# Patient Record
Sex: Female | Born: 1985
Health system: Southern US, Community
[De-identification: ages and names within clinical notes are randomized; demographics above are authoritative.]

## PROBLEM LIST (undated history)

## (undated) DIAGNOSIS — I951 Orthostatic hypotension: Secondary | ICD-10-CM

## (undated) DIAGNOSIS — L988 Other specified disorders of the skin and subcutaneous tissue: Secondary | ICD-10-CM

## (undated) DIAGNOSIS — J309 Allergic rhinitis, unspecified: Secondary | ICD-10-CM

## (undated) DIAGNOSIS — J9601 Acute respiratory failure with hypoxia: Secondary | ICD-10-CM

## (undated) DIAGNOSIS — B372 Candidiasis of skin and nail: Secondary | ICD-10-CM

## (undated) DIAGNOSIS — G40A09 Absence epileptic syndrome, not intractable, without status epilepticus: Secondary | ICD-10-CM

## (undated) DIAGNOSIS — F259 Schizoaffective disorder, unspecified: Secondary | ICD-10-CM

## (undated) DIAGNOSIS — G8929 Other chronic pain: Secondary | ICD-10-CM

## (undated) DIAGNOSIS — J42 Unspecified chronic bronchitis: Secondary | ICD-10-CM

## (undated) DIAGNOSIS — H538 Other visual disturbances: Secondary | ICD-10-CM

## (undated) DIAGNOSIS — D509 Iron deficiency anemia, unspecified: Secondary | ICD-10-CM

## (undated) DIAGNOSIS — E119 Type 2 diabetes mellitus without complications: Secondary | ICD-10-CM

## (undated) DIAGNOSIS — E1142 Type 2 diabetes mellitus with diabetic polyneuropathy: Secondary | ICD-10-CM

## (undated) DIAGNOSIS — R609 Edema, unspecified: Secondary | ICD-10-CM

## (undated) DIAGNOSIS — F3132 Bipolar disorder, current episode depressed, moderate: Secondary | ICD-10-CM

## (undated) DIAGNOSIS — F17210 Nicotine dependence, cigarettes, uncomplicated: Secondary | ICD-10-CM

## (undated) DIAGNOSIS — S2243XG Multiple fractures of ribs, bilateral, subsequent encounter for fracture with delayed healing: Secondary | ICD-10-CM

## (undated) DIAGNOSIS — I10 Essential (primary) hypertension: Secondary | ICD-10-CM

## (undated) DIAGNOSIS — D803 Selective deficiency of immunoglobulin G [IgG] subclasses: Secondary | ICD-10-CM

## (undated) DIAGNOSIS — E559 Vitamin D deficiency, unspecified: Secondary | ICD-10-CM

## (undated) DIAGNOSIS — F489 Nonpsychotic mental disorder, unspecified: Secondary | ICD-10-CM

## (undated) DIAGNOSIS — J479 Bronchiectasis, uncomplicated: Secondary | ICD-10-CM

## (undated) DIAGNOSIS — G4733 Obstructive sleep apnea (adult) (pediatric): Secondary | ICD-10-CM

## (undated) DIAGNOSIS — R112 Nausea with vomiting, unspecified: Secondary | ICD-10-CM

## (undated) DIAGNOSIS — M549 Dorsalgia, unspecified: Secondary | ICD-10-CM

## (undated) DIAGNOSIS — N939 Abnormal uterine and vaginal bleeding, unspecified: Secondary | ICD-10-CM

## (undated) DIAGNOSIS — M542 Cervicalgia: Secondary | ICD-10-CM

## (undated) DIAGNOSIS — R6889 Other general symptoms and signs: Secondary | ICD-10-CM

## (undated) DIAGNOSIS — U071 COVID-19: Secondary | ICD-10-CM

## (undated) DIAGNOSIS — R0789 Other chest pain: Secondary | ICD-10-CM

## (undated) DIAGNOSIS — E11319 Type 2 diabetes mellitus with unspecified diabetic retinopathy without macular edema: Secondary | ICD-10-CM

## (undated) DIAGNOSIS — I5032 Chronic diastolic (congestive) heart failure: Secondary | ICD-10-CM

## (undated) DIAGNOSIS — F4310 Post-traumatic stress disorder, unspecified: Secondary | ICD-10-CM

## (undated) DIAGNOSIS — L259 Unspecified contact dermatitis, unspecified cause: Secondary | ICD-10-CM

## (undated) DIAGNOSIS — T300 Burn of unspecified body region, unspecified degree: Secondary | ICD-10-CM

## (undated) DIAGNOSIS — E441 Mild protein-calorie malnutrition: Secondary | ICD-10-CM

## (undated) DIAGNOSIS — K219 Gastro-esophageal reflux disease without esophagitis: Secondary | ICD-10-CM

## (undated) DIAGNOSIS — E1165 Type 2 diabetes mellitus with hyperglycemia: Secondary | ICD-10-CM

## (undated) DIAGNOSIS — J45909 Unspecified asthma, uncomplicated: Secondary | ICD-10-CM

## (undated) DIAGNOSIS — J449 Chronic obstructive pulmonary disease, unspecified: Secondary | ICD-10-CM

## (undated) DIAGNOSIS — F419 Anxiety disorder, unspecified: Secondary | ICD-10-CM

## (undated) HISTORY — DX: Bronchiectasis, uncomplicated: J47.9

## (undated) HISTORY — DX: Nicotine dependence, cigarettes, uncomplicated: F17.210

## (undated) HISTORY — DX: Bipolar disorder, current episode depressed, moderate: F31.32

## (undated) HISTORY — DX: Cervicalgia: M54.2

## (undated) HISTORY — DX: Other chest pain: R07.89

## (undated) HISTORY — DX: Type 2 diabetes mellitus with unspecified diabetic retinopathy without macular edema: E11.319

## (undated) HISTORY — DX: Obstructive sleep apnea (adult) (pediatric): G47.33

## (undated) HISTORY — DX: Unspecified asthma, uncomplicated: J45.909

## (undated) HISTORY — PX: TUBAL LIGATION: SHX77

## (undated) HISTORY — DX: Post-traumatic stress disorder, unspecified: F43.10

## (undated) HISTORY — DX: Allergic rhinitis, unspecified: J30.9

## (undated) HISTORY — DX: Orthostatic hypotension: I95.1

## (undated) HISTORY — DX: Schizoaffective disorder, unspecified: F25.9

## (undated) HISTORY — DX: Dorsalgia, unspecified: M54.9

## (undated) HISTORY — DX: Absence epileptic syndrome, not intractable, without status epilepticus: G40.A09

## (undated) HISTORY — DX: Other chronic pain: G89.29

## (undated) HISTORY — DX: Type 2 diabetes mellitus with hyperglycemia: E11.65

## (undated) HISTORY — DX: Other specified disorders of the skin and subcutaneous tissue: L98.8

## (undated) HISTORY — DX: Essential (primary) hypertension: I10

## (undated) HISTORY — DX: Unspecified chronic bronchitis: J42

## (undated) HISTORY — DX: Chronic obstructive pulmonary disease, unspecified: J44.9

## (undated) HISTORY — DX: Unspecified contact dermatitis, unspecified cause: L25.9

## (undated) HISTORY — DX: Selective deficiency of immunoglobulin g (igg) subclasses: D80.3

## (undated) HISTORY — DX: Acute respiratory failure with hypoxia: J96.01

## (undated) HISTORY — DX: Chronic diastolic (congestive) heart failure: I50.32

## (undated) HISTORY — DX: Candidiasis of skin and nail: B37.2

## (undated) HISTORY — DX: Other visual disturbances: H53.8

## (undated) HISTORY — DX: Anxiety disorder, unspecified: F41.9

## (undated) HISTORY — PX: TONSILLECTOMY: SUR1361

## (undated) HISTORY — DX: Burn of unspecified body region, unspecified degree: T30.0

## (undated) HISTORY — DX: Nausea with vomiting, unspecified: R11.2

## (undated) HISTORY — DX: Type 2 diabetes mellitus with diabetic polyneuropathy: E11.42

## (undated) HISTORY — DX: Other general symptoms and signs: R68.89

## (undated) HISTORY — DX: Multiple fractures of ribs, bilateral, subsequent encounter for fracture with delayed healing: S22.43XG

## (undated) HISTORY — DX: Abnormal uterine and vaginal bleeding, unspecified: N93.9

## (undated) HISTORY — DX: Vitamin D deficiency, unspecified: E55.9

## (undated) HISTORY — DX: Morbid (severe) obesity due to excess calories: E66.01

## (undated) HISTORY — DX: Mild protein-calorie malnutrition: E44.1

## (undated) HISTORY — DX: COVID-19: U07.1

## (undated) HISTORY — DX: Iron deficiency anemia, unspecified: D50.9

## (undated) HISTORY — DX: Edema, unspecified: R60.9

## (undated) HISTORY — DX: Gastro-esophageal reflux disease without esophagitis: K21.9

---

## 2016-12-05 DIAGNOSIS — Z01419 Encounter for gynecological examination (general) (routine) without abnormal findings: Secondary | ICD-10-CM | POA: Diagnosis not present

## 2016-12-05 DIAGNOSIS — L03311 Cellulitis of abdominal wall: Secondary | ICD-10-CM | POA: Diagnosis not present

## 2016-12-12 DIAGNOSIS — L0291 Cutaneous abscess, unspecified: Secondary | ICD-10-CM | POA: Diagnosis not present

## 2016-12-12 DIAGNOSIS — L02211 Cutaneous abscess of abdominal wall: Secondary | ICD-10-CM | POA: Diagnosis not present

## 2016-12-19 DIAGNOSIS — Z5189 Encounter for other specified aftercare: Secondary | ICD-10-CM | POA: Diagnosis not present

## 2016-12-22 DIAGNOSIS — H52223 Regular astigmatism, bilateral: Secondary | ICD-10-CM | POA: Diagnosis not present

## 2016-12-22 DIAGNOSIS — H5310 Unspecified subjective visual disturbances: Secondary | ICD-10-CM | POA: Diagnosis not present

## 2016-12-22 DIAGNOSIS — H5213 Myopia, bilateral: Secondary | ICD-10-CM | POA: Diagnosis not present

## 2017-02-04 DIAGNOSIS — E785 Hyperlipidemia, unspecified: Secondary | ICD-10-CM | POA: Diagnosis not present

## 2017-02-04 DIAGNOSIS — E559 Vitamin D deficiency, unspecified: Secondary | ICD-10-CM | POA: Diagnosis not present

## 2017-02-04 DIAGNOSIS — E109 Type 1 diabetes mellitus without complications: Secondary | ICD-10-CM | POA: Diagnosis not present

## 2017-02-04 DIAGNOSIS — I10 Essential (primary) hypertension: Secondary | ICD-10-CM | POA: Diagnosis not present

## 2017-02-23 DIAGNOSIS — Z79899 Other long term (current) drug therapy: Secondary | ICD-10-CM | POA: Diagnosis not present

## 2017-05-08 DIAGNOSIS — R000 Tachycardia, unspecified: Secondary | ICD-10-CM | POA: Diagnosis not present

## 2017-05-08 DIAGNOSIS — Z794 Long term (current) use of insulin: Secondary | ICD-10-CM | POA: Diagnosis not present

## 2017-05-08 DIAGNOSIS — R05 Cough: Secondary | ICD-10-CM | POA: Diagnosis not present

## 2017-05-08 DIAGNOSIS — E109 Type 1 diabetes mellitus without complications: Secondary | ICD-10-CM | POA: Diagnosis not present

## 2017-05-13 DIAGNOSIS — I10 Essential (primary) hypertension: Secondary | ICD-10-CM | POA: Diagnosis not present

## 2017-05-13 DIAGNOSIS — E109 Type 1 diabetes mellitus without complications: Secondary | ICD-10-CM | POA: Diagnosis not present

## 2017-05-13 DIAGNOSIS — E785 Hyperlipidemia, unspecified: Secondary | ICD-10-CM | POA: Diagnosis not present

## 2017-05-13 DIAGNOSIS — E669 Obesity, unspecified: Secondary | ICD-10-CM | POA: Diagnosis not present

## 2017-06-09 DIAGNOSIS — B86 Scabies: Secondary | ICD-10-CM | POA: Diagnosis not present

## 2017-07-23 DIAGNOSIS — R490 Dysphonia: Secondary | ICD-10-CM | POA: Diagnosis not present

## 2017-07-23 DIAGNOSIS — K219 Gastro-esophageal reflux disease without esophagitis: Secondary | ICD-10-CM | POA: Diagnosis not present

## 2017-08-17 DIAGNOSIS — E11319 Type 2 diabetes mellitus with unspecified diabetic retinopathy without macular edema: Secondary | ICD-10-CM | POA: Diagnosis not present

## 2017-08-17 DIAGNOSIS — Z794 Long term (current) use of insulin: Secondary | ICD-10-CM | POA: Diagnosis not present

## 2017-08-17 DIAGNOSIS — E10319 Type 1 diabetes mellitus with unspecified diabetic retinopathy without macular edema: Secondary | ICD-10-CM | POA: Diagnosis not present

## 2017-08-17 DIAGNOSIS — H52223 Regular astigmatism, bilateral: Secondary | ICD-10-CM | POA: Diagnosis not present

## 2017-08-17 DIAGNOSIS — H5213 Myopia, bilateral: Secondary | ICD-10-CM | POA: Diagnosis not present

## 2017-10-02 DIAGNOSIS — E785 Hyperlipidemia, unspecified: Secondary | ICD-10-CM | POA: Diagnosis not present

## 2017-10-02 DIAGNOSIS — E109 Type 1 diabetes mellitus without complications: Secondary | ICD-10-CM | POA: Diagnosis not present

## 2017-10-02 DIAGNOSIS — E559 Vitamin D deficiency, unspecified: Secondary | ICD-10-CM | POA: Diagnosis not present

## 2017-10-07 DIAGNOSIS — E785 Hyperlipidemia, unspecified: Secondary | ICD-10-CM | POA: Diagnosis not present

## 2017-10-07 DIAGNOSIS — E559 Vitamin D deficiency, unspecified: Secondary | ICD-10-CM | POA: Diagnosis not present

## 2017-10-07 DIAGNOSIS — I10 Essential (primary) hypertension: Secondary | ICD-10-CM | POA: Diagnosis not present

## 2017-10-07 DIAGNOSIS — E109 Type 1 diabetes mellitus without complications: Secondary | ICD-10-CM | POA: Diagnosis not present

## 2017-10-12 DIAGNOSIS — I10 Essential (primary) hypertension: Secondary | ICD-10-CM | POA: Diagnosis not present

## 2017-10-12 DIAGNOSIS — Z6841 Body Mass Index (BMI) 40.0 and over, adult: Secondary | ICD-10-CM | POA: Diagnosis not present

## 2017-10-12 DIAGNOSIS — E1065 Type 1 diabetes mellitus with hyperglycemia: Secondary | ICD-10-CM | POA: Diagnosis not present

## 2017-10-12 DIAGNOSIS — J4521 Mild intermittent asthma with (acute) exacerbation: Secondary | ICD-10-CM | POA: Diagnosis not present

## 2017-10-12 DIAGNOSIS — F250 Schizoaffective disorder, bipolar type: Secondary | ICD-10-CM | POA: Diagnosis not present

## 2017-10-12 DIAGNOSIS — E108 Type 1 diabetes mellitus with unspecified complications: Secondary | ICD-10-CM | POA: Diagnosis not present

## 2017-10-12 DIAGNOSIS — J3081 Allergic rhinitis due to animal (cat) (dog) hair and dander: Secondary | ICD-10-CM | POA: Diagnosis not present

## 2017-10-12 DIAGNOSIS — Z720 Tobacco use: Secondary | ICD-10-CM | POA: Diagnosis not present

## 2017-12-08 DIAGNOSIS — J45909 Unspecified asthma, uncomplicated: Secondary | ICD-10-CM | POA: Diagnosis not present

## 2017-12-08 DIAGNOSIS — Z889 Allergy status to unspecified drugs, medicaments and biological substances status: Secondary | ICD-10-CM | POA: Diagnosis not present

## 2017-12-28 DIAGNOSIS — K219 Gastro-esophageal reflux disease without esophagitis: Secondary | ICD-10-CM | POA: Diagnosis not present

## 2017-12-28 DIAGNOSIS — R1310 Dysphagia, unspecified: Secondary | ICD-10-CM | POA: Diagnosis not present

## 2018-01-26 DIAGNOSIS — E109 Type 1 diabetes mellitus without complications: Secondary | ICD-10-CM | POA: Diagnosis not present

## 2018-03-04 DIAGNOSIS — L309 Dermatitis, unspecified: Secondary | ICD-10-CM | POA: Diagnosis not present

## 2018-03-15 DIAGNOSIS — E559 Vitamin D deficiency, unspecified: Secondary | ICD-10-CM | POA: Diagnosis not present

## 2018-03-15 DIAGNOSIS — I10 Essential (primary) hypertension: Secondary | ICD-10-CM | POA: Diagnosis not present

## 2018-03-15 DIAGNOSIS — E785 Hyperlipidemia, unspecified: Secondary | ICD-10-CM | POA: Diagnosis not present

## 2018-03-15 DIAGNOSIS — E109 Type 1 diabetes mellitus without complications: Secondary | ICD-10-CM | POA: Diagnosis not present

## 2018-03-26 DIAGNOSIS — J441 Chronic obstructive pulmonary disease with (acute) exacerbation: Secondary | ICD-10-CM | POA: Diagnosis not present

## 2018-03-26 DIAGNOSIS — R918 Other nonspecific abnormal finding of lung field: Secondary | ICD-10-CM | POA: Diagnosis not present

## 2018-03-26 DIAGNOSIS — Z720 Tobacco use: Secondary | ICD-10-CM | POA: Diagnosis not present

## 2018-04-12 DIAGNOSIS — Z9109 Other allergy status, other than to drugs and biological substances: Secondary | ICD-10-CM | POA: Diagnosis not present

## 2018-04-12 DIAGNOSIS — J310 Chronic rhinitis: Secondary | ICD-10-CM | POA: Diagnosis not present

## 2018-04-12 DIAGNOSIS — Z720 Tobacco use: Secondary | ICD-10-CM | POA: Diagnosis not present

## 2018-04-12 DIAGNOSIS — I10 Essential (primary) hypertension: Secondary | ICD-10-CM | POA: Diagnosis not present

## 2018-05-04 DIAGNOSIS — F603 Borderline personality disorder: Secondary | ICD-10-CM | POA: Diagnosis not present

## 2018-05-04 DIAGNOSIS — F411 Generalized anxiety disorder: Secondary | ICD-10-CM | POA: Diagnosis not present

## 2018-05-04 DIAGNOSIS — F4310 Post-traumatic stress disorder, unspecified: Secondary | ICD-10-CM | POA: Diagnosis not present

## 2018-05-04 DIAGNOSIS — F250 Schizoaffective disorder, bipolar type: Secondary | ICD-10-CM | POA: Diagnosis not present

## 2018-05-12 DIAGNOSIS — M79661 Pain in right lower leg: Secondary | ICD-10-CM | POA: Diagnosis not present

## 2018-05-12 DIAGNOSIS — M7989 Other specified soft tissue disorders: Secondary | ICD-10-CM | POA: Diagnosis not present

## 2018-05-12 DIAGNOSIS — R609 Edema, unspecified: Secondary | ICD-10-CM | POA: Diagnosis not present

## 2018-05-20 DIAGNOSIS — J441 Chronic obstructive pulmonary disease with (acute) exacerbation: Secondary | ICD-10-CM | POA: Diagnosis not present

## 2018-05-20 DIAGNOSIS — I10 Essential (primary) hypertension: Secondary | ICD-10-CM | POA: Diagnosis not present

## 2018-05-20 DIAGNOSIS — F3181 Bipolar II disorder: Secondary | ICD-10-CM | POA: Diagnosis not present

## 2018-05-20 DIAGNOSIS — E1142 Type 2 diabetes mellitus with diabetic polyneuropathy: Secondary | ICD-10-CM | POA: Diagnosis not present

## 2018-05-20 DIAGNOSIS — K219 Gastro-esophageal reflux disease without esophagitis: Secondary | ICD-10-CM | POA: Diagnosis not present

## 2018-05-20 DIAGNOSIS — E1042 Type 1 diabetes mellitus with diabetic polyneuropathy: Secondary | ICD-10-CM | POA: Diagnosis not present

## 2018-05-31 DIAGNOSIS — F319 Bipolar disorder, unspecified: Secondary | ICD-10-CM | POA: Insufficient documentation

## 2018-05-31 DIAGNOSIS — F29 Unspecified psychosis not due to a substance or known physiological condition: Secondary | ICD-10-CM | POA: Insufficient documentation

## 2018-05-31 DIAGNOSIS — F439 Reaction to severe stress, unspecified: Secondary | ICD-10-CM | POA: Insufficient documentation

## 2018-05-31 DIAGNOSIS — F609 Personality disorder, unspecified: Secondary | ICD-10-CM | POA: Insufficient documentation

## 2018-05-31 HISTORY — DX: Reaction to severe stress, unspecified: F43.9

## 2018-05-31 HISTORY — DX: Bipolar disorder, unspecified: F31.9

## 2018-05-31 HISTORY — DX: Unspecified psychosis not due to a substance or known physiological condition: F29

## 2018-05-31 HISTORY — DX: Personality disorder, unspecified: F60.9

## 2018-06-07 DIAGNOSIS — R030 Elevated blood-pressure reading, without diagnosis of hypertension: Secondary | ICD-10-CM

## 2018-06-07 DIAGNOSIS — F609 Personality disorder, unspecified: Secondary | ICD-10-CM | POA: Diagnosis not present

## 2018-06-07 HISTORY — DX: Elevated blood-pressure reading, without diagnosis of hypertension: R03.0

## 2018-06-07 HISTORY — DX: Morbid (severe) obesity due to excess calories: E66.01

## 2018-06-15 DIAGNOSIS — H35033 Hypertensive retinopathy, bilateral: Secondary | ICD-10-CM | POA: Diagnosis not present

## 2018-06-15 DIAGNOSIS — H52223 Regular astigmatism, bilateral: Secondary | ICD-10-CM | POA: Diagnosis not present

## 2018-06-15 DIAGNOSIS — E119 Type 2 diabetes mellitus without complications: Secondary | ICD-10-CM | POA: Diagnosis not present

## 2018-06-18 DIAGNOSIS — F29 Unspecified psychosis not due to a substance or known physiological condition: Secondary | ICD-10-CM | POA: Diagnosis not present

## 2018-06-28 DIAGNOSIS — E1142 Type 2 diabetes mellitus with diabetic polyneuropathy: Secondary | ICD-10-CM | POA: Diagnosis not present

## 2018-06-28 DIAGNOSIS — Z794 Long term (current) use of insulin: Secondary | ICD-10-CM | POA: Diagnosis not present

## 2018-07-07 DIAGNOSIS — F319 Bipolar disorder, unspecified: Secondary | ICD-10-CM | POA: Diagnosis not present

## 2018-08-09 DIAGNOSIS — F419 Anxiety disorder, unspecified: Secondary | ICD-10-CM | POA: Diagnosis not present

## 2018-08-16 DIAGNOSIS — E11649 Type 2 diabetes mellitus with hypoglycemia without coma: Secondary | ICD-10-CM

## 2018-08-16 HISTORY — DX: Type 2 diabetes mellitus with hypoglycemia without coma: E11.649

## 2018-08-17 DIAGNOSIS — F419 Anxiety disorder, unspecified: Secondary | ICD-10-CM | POA: Diagnosis not present

## 2018-08-23 DIAGNOSIS — J4541 Moderate persistent asthma with (acute) exacerbation: Secondary | ICD-10-CM | POA: Diagnosis not present

## 2018-08-23 DIAGNOSIS — G4709 Other insomnia: Secondary | ICD-10-CM | POA: Diagnosis not present

## 2018-09-07 DIAGNOSIS — F319 Bipolar disorder, unspecified: Secondary | ICD-10-CM | POA: Diagnosis not present

## 2018-09-20 DIAGNOSIS — I10 Essential (primary) hypertension: Secondary | ICD-10-CM | POA: Diagnosis not present

## 2018-09-20 DIAGNOSIS — Z23 Encounter for immunization: Secondary | ICD-10-CM | POA: Diagnosis not present

## 2018-09-20 DIAGNOSIS — E1042 Type 1 diabetes mellitus with diabetic polyneuropathy: Secondary | ICD-10-CM | POA: Diagnosis not present

## 2018-09-20 DIAGNOSIS — F17203 Nicotine dependence unspecified, with withdrawal: Secondary | ICD-10-CM | POA: Diagnosis not present

## 2018-09-20 DIAGNOSIS — M41126 Adolescent idiopathic scoliosis, lumbar region: Secondary | ICD-10-CM | POA: Diagnosis not present

## 2018-09-20 DIAGNOSIS — Z6841 Body Mass Index (BMI) 40.0 and over, adult: Secondary | ICD-10-CM | POA: Diagnosis not present

## 2018-09-20 DIAGNOSIS — J441 Chronic obstructive pulmonary disease with (acute) exacerbation: Secondary | ICD-10-CM | POA: Diagnosis not present

## 2018-09-20 DIAGNOSIS — F3181 Bipolar II disorder: Secondary | ICD-10-CM | POA: Diagnosis not present

## 2018-09-22 DIAGNOSIS — G4733 Obstructive sleep apnea (adult) (pediatric): Secondary | ICD-10-CM | POA: Diagnosis not present

## 2018-09-24 DIAGNOSIS — H6523 Chronic serous otitis media, bilateral: Secondary | ICD-10-CM | POA: Diagnosis not present

## 2018-09-24 DIAGNOSIS — R070 Pain in throat: Secondary | ICD-10-CM | POA: Diagnosis not present

## 2018-09-30 DIAGNOSIS — F319 Bipolar disorder, unspecified: Secondary | ICD-10-CM | POA: Diagnosis not present

## 2018-10-26 DIAGNOSIS — Z9622 Myringotomy tube(s) status: Secondary | ICD-10-CM | POA: Diagnosis not present

## 2018-10-26 DIAGNOSIS — H938X9 Other specified disorders of ear, unspecified ear: Secondary | ICD-10-CM | POA: Diagnosis not present

## 2018-10-26 DIAGNOSIS — H7403 Tympanosclerosis, bilateral: Secondary | ICD-10-CM | POA: Diagnosis not present

## 2018-11-01 DIAGNOSIS — F319 Bipolar disorder, unspecified: Secondary | ICD-10-CM | POA: Diagnosis not present

## 2018-11-26 DIAGNOSIS — J4541 Moderate persistent asthma with (acute) exacerbation: Secondary | ICD-10-CM | POA: Diagnosis not present

## 2018-11-26 DIAGNOSIS — J3089 Other allergic rhinitis: Secondary | ICD-10-CM | POA: Diagnosis not present

## 2018-11-26 DIAGNOSIS — E1165 Type 2 diabetes mellitus with hyperglycemia: Secondary | ICD-10-CM | POA: Diagnosis not present

## 2018-11-26 DIAGNOSIS — H66006 Acute suppurative otitis media without spontaneous rupture of ear drum, recurrent, bilateral: Secondary | ICD-10-CM | POA: Diagnosis not present

## 2018-11-29 DIAGNOSIS — G4709 Other insomnia: Secondary | ICD-10-CM | POA: Diagnosis not present

## 2018-11-29 DIAGNOSIS — E1042 Type 1 diabetes mellitus with diabetic polyneuropathy: Secondary | ICD-10-CM | POA: Diagnosis not present

## 2018-11-29 DIAGNOSIS — H9202 Otalgia, left ear: Secondary | ICD-10-CM | POA: Diagnosis not present

## 2018-12-09 DIAGNOSIS — E1142 Type 2 diabetes mellitus with diabetic polyneuropathy: Secondary | ICD-10-CM | POA: Diagnosis not present

## 2018-12-09 DIAGNOSIS — Z794 Long term (current) use of insulin: Secondary | ICD-10-CM | POA: Diagnosis not present

## 2018-12-09 DIAGNOSIS — E1165 Type 2 diabetes mellitus with hyperglycemia: Secondary | ICD-10-CM | POA: Diagnosis not present

## 2018-12-10 DIAGNOSIS — S2231XA Fracture of one rib, right side, initial encounter for closed fracture: Secondary | ICD-10-CM | POA: Diagnosis not present

## 2018-12-10 DIAGNOSIS — F319 Bipolar disorder, unspecified: Secondary | ICD-10-CM | POA: Diagnosis not present

## 2018-12-10 DIAGNOSIS — S2232XA Fracture of one rib, left side, initial encounter for closed fracture: Secondary | ICD-10-CM | POA: Diagnosis not present

## 2018-12-21 DIAGNOSIS — S2232XD Fracture of one rib, left side, subsequent encounter for fracture with routine healing: Secondary | ICD-10-CM | POA: Diagnosis not present

## 2018-12-21 DIAGNOSIS — J020 Streptococcal pharyngitis: Secondary | ICD-10-CM | POA: Diagnosis not present

## 2018-12-24 DIAGNOSIS — G4733 Obstructive sleep apnea (adult) (pediatric): Secondary | ICD-10-CM | POA: Diagnosis not present

## 2018-12-24 DIAGNOSIS — E662 Morbid (severe) obesity with alveolar hypoventilation: Secondary | ICD-10-CM | POA: Diagnosis not present

## 2018-12-29 DIAGNOSIS — S2232XD Fracture of one rib, left side, subsequent encounter for fracture with routine healing: Secondary | ICD-10-CM | POA: Diagnosis not present

## 2019-01-03 DIAGNOSIS — G4700 Insomnia, unspecified: Secondary | ICD-10-CM | POA: Diagnosis not present

## 2019-01-19 DIAGNOSIS — F319 Bipolar disorder, unspecified: Secondary | ICD-10-CM | POA: Diagnosis not present

## 2019-01-24 DIAGNOSIS — J441 Chronic obstructive pulmonary disease with (acute) exacerbation: Secondary | ICD-10-CM | POA: Diagnosis not present

## 2019-01-24 DIAGNOSIS — S2232XD Fracture of one rib, left side, subsequent encounter for fracture with routine healing: Secondary | ICD-10-CM | POA: Diagnosis not present

## 2019-01-24 DIAGNOSIS — S2232XA Fracture of one rib, left side, initial encounter for closed fracture: Secondary | ICD-10-CM | POA: Diagnosis not present

## 2019-02-11 DIAGNOSIS — H6503 Acute serous otitis media, bilateral: Secondary | ICD-10-CM | POA: Diagnosis not present

## 2019-02-21 DIAGNOSIS — H6502 Acute serous otitis media, left ear: Secondary | ICD-10-CM | POA: Diagnosis not present

## 2019-03-10 DIAGNOSIS — G4709 Other insomnia: Secondary | ICD-10-CM | POA: Diagnosis not present

## 2019-03-10 DIAGNOSIS — I10 Essential (primary) hypertension: Secondary | ICD-10-CM | POA: Diagnosis not present

## 2019-03-10 DIAGNOSIS — E662 Morbid (severe) obesity with alveolar hypoventilation: Secondary | ICD-10-CM | POA: Diagnosis not present

## 2019-03-10 DIAGNOSIS — G4733 Obstructive sleep apnea (adult) (pediatric): Secondary | ICD-10-CM | POA: Diagnosis not present

## 2019-03-10 DIAGNOSIS — F3181 Bipolar II disorder: Secondary | ICD-10-CM | POA: Diagnosis not present

## 2019-03-13 DIAGNOSIS — S2242XA Multiple fractures of ribs, left side, initial encounter for closed fracture: Secondary | ICD-10-CM | POA: Diagnosis not present

## 2019-03-13 DIAGNOSIS — R0781 Pleurodynia: Secondary | ICD-10-CM | POA: Diagnosis not present

## 2019-03-13 DIAGNOSIS — R0602 Shortness of breath: Secondary | ICD-10-CM | POA: Diagnosis not present

## 2019-03-13 DIAGNOSIS — E119 Type 2 diabetes mellitus without complications: Secondary | ICD-10-CM | POA: Diagnosis not present

## 2019-03-14 DIAGNOSIS — J90 Pleural effusion, not elsewhere classified: Secondary | ICD-10-CM | POA: Diagnosis not present

## 2019-03-14 DIAGNOSIS — J9811 Atelectasis: Secondary | ICD-10-CM | POA: Diagnosis not present

## 2019-03-14 DIAGNOSIS — S2242XA Multiple fractures of ribs, left side, initial encounter for closed fracture: Secondary | ICD-10-CM | POA: Diagnosis not present

## 2019-03-14 DIAGNOSIS — R0781 Pleurodynia: Secondary | ICD-10-CM | POA: Diagnosis not present

## 2019-03-17 DIAGNOSIS — S2242XA Multiple fractures of ribs, left side, initial encounter for closed fracture: Secondary | ICD-10-CM | POA: Diagnosis not present

## 2019-03-17 DIAGNOSIS — E559 Vitamin D deficiency, unspecified: Secondary | ICD-10-CM | POA: Diagnosis not present

## 2019-03-17 DIAGNOSIS — Z1159 Encounter for screening for other viral diseases: Secondary | ICD-10-CM | POA: Diagnosis not present

## 2019-03-17 DIAGNOSIS — E038 Other specified hypothyroidism: Secondary | ICD-10-CM | POA: Diagnosis not present

## 2019-03-24 DIAGNOSIS — E109 Type 1 diabetes mellitus without complications: Secondary | ICD-10-CM | POA: Diagnosis not present

## 2019-03-28 DIAGNOSIS — J301 Allergic rhinitis due to pollen: Secondary | ICD-10-CM | POA: Diagnosis not present

## 2019-03-28 DIAGNOSIS — J4540 Moderate persistent asthma, uncomplicated: Secondary | ICD-10-CM | POA: Diagnosis not present

## 2019-03-28 DIAGNOSIS — G4733 Obstructive sleep apnea (adult) (pediatric): Secondary | ICD-10-CM | POA: Diagnosis not present

## 2019-03-31 DIAGNOSIS — S2242XD Multiple fractures of ribs, left side, subsequent encounter for fracture with routine healing: Secondary | ICD-10-CM | POA: Diagnosis not present

## 2019-03-31 DIAGNOSIS — J441 Chronic obstructive pulmonary disease with (acute) exacerbation: Secondary | ICD-10-CM | POA: Diagnosis not present

## 2019-03-31 DIAGNOSIS — Z1159 Encounter for screening for other viral diseases: Secondary | ICD-10-CM | POA: Diagnosis not present

## 2019-04-11 DIAGNOSIS — Z1159 Encounter for screening for other viral diseases: Secondary | ICD-10-CM | POA: Diagnosis not present

## 2019-04-11 DIAGNOSIS — S2242XD Multiple fractures of ribs, left side, subsequent encounter for fracture with routine healing: Secondary | ICD-10-CM | POA: Diagnosis not present

## 2019-04-15 DIAGNOSIS — G4700 Insomnia, unspecified: Secondary | ICD-10-CM | POA: Diagnosis not present

## 2019-04-20 DIAGNOSIS — E109 Type 1 diabetes mellitus without complications: Secondary | ICD-10-CM | POA: Diagnosis not present

## 2019-04-20 DIAGNOSIS — Z794 Long term (current) use of insulin: Secondary | ICD-10-CM | POA: Diagnosis not present

## 2019-04-23 DIAGNOSIS — E119 Type 2 diabetes mellitus without complications: Secondary | ICD-10-CM | POA: Diagnosis not present

## 2019-04-25 DIAGNOSIS — J301 Allergic rhinitis due to pollen: Secondary | ICD-10-CM | POA: Diagnosis not present

## 2019-04-25 DIAGNOSIS — R5383 Other fatigue: Secondary | ICD-10-CM | POA: Diagnosis not present

## 2019-04-25 DIAGNOSIS — J4540 Moderate persistent asthma, uncomplicated: Secondary | ICD-10-CM | POA: Diagnosis not present

## 2019-04-25 DIAGNOSIS — G4733 Obstructive sleep apnea (adult) (pediatric): Secondary | ICD-10-CM | POA: Diagnosis not present

## 2019-04-28 DIAGNOSIS — G4733 Obstructive sleep apnea (adult) (pediatric): Secondary | ICD-10-CM | POA: Diagnosis not present

## 2019-05-04 DIAGNOSIS — G4733 Obstructive sleep apnea (adult) (pediatric): Secondary | ICD-10-CM | POA: Diagnosis not present

## 2019-05-09 DIAGNOSIS — J301 Allergic rhinitis due to pollen: Secondary | ICD-10-CM | POA: Diagnosis not present

## 2019-05-09 DIAGNOSIS — R5383 Other fatigue: Secondary | ICD-10-CM | POA: Diagnosis not present

## 2019-05-09 DIAGNOSIS — G4733 Obstructive sleep apnea (adult) (pediatric): Secondary | ICD-10-CM | POA: Diagnosis not present

## 2019-05-09 DIAGNOSIS — J4540 Moderate persistent asthma, uncomplicated: Secondary | ICD-10-CM | POA: Diagnosis not present

## 2019-05-13 DIAGNOSIS — L03311 Cellulitis of abdominal wall: Secondary | ICD-10-CM | POA: Diagnosis not present

## 2019-05-13 DIAGNOSIS — Z1159 Encounter for screening for other viral diseases: Secondary | ICD-10-CM | POA: Diagnosis not present

## 2019-05-19 DIAGNOSIS — E1165 Type 2 diabetes mellitus with hyperglycemia: Secondary | ICD-10-CM | POA: Diagnosis not present

## 2019-05-19 DIAGNOSIS — Z794 Long term (current) use of insulin: Secondary | ICD-10-CM | POA: Diagnosis not present

## 2019-05-19 DIAGNOSIS — E1142 Type 2 diabetes mellitus with diabetic polyneuropathy: Secondary | ICD-10-CM | POA: Diagnosis not present

## 2019-05-20 DIAGNOSIS — Z794 Long term (current) use of insulin: Secondary | ICD-10-CM | POA: Diagnosis not present

## 2019-05-20 DIAGNOSIS — E109 Type 1 diabetes mellitus without complications: Secondary | ICD-10-CM | POA: Diagnosis not present

## 2019-05-28 DIAGNOSIS — G4733 Obstructive sleep apnea (adult) (pediatric): Secondary | ICD-10-CM | POA: Diagnosis not present

## 2019-05-31 DIAGNOSIS — G4733 Obstructive sleep apnea (adult) (pediatric): Secondary | ICD-10-CM | POA: Diagnosis not present

## 2019-05-31 DIAGNOSIS — Z1159 Encounter for screening for other viral diseases: Secondary | ICD-10-CM | POA: Diagnosis not present

## 2019-05-31 DIAGNOSIS — E1042 Type 1 diabetes mellitus with diabetic polyneuropathy: Secondary | ICD-10-CM | POA: Diagnosis not present

## 2019-05-31 DIAGNOSIS — I10 Essential (primary) hypertension: Secondary | ICD-10-CM | POA: Diagnosis not present

## 2019-06-10 DIAGNOSIS — R600 Localized edema: Secondary | ICD-10-CM | POA: Diagnosis not present

## 2019-06-10 DIAGNOSIS — H9201 Otalgia, right ear: Secondary | ICD-10-CM | POA: Diagnosis not present

## 2019-06-10 DIAGNOSIS — Z1159 Encounter for screening for other viral diseases: Secondary | ICD-10-CM | POA: Diagnosis not present

## 2019-06-17 DIAGNOSIS — Z794 Long term (current) use of insulin: Secondary | ICD-10-CM | POA: Diagnosis not present

## 2019-06-17 DIAGNOSIS — E109 Type 1 diabetes mellitus without complications: Secondary | ICD-10-CM | POA: Diagnosis not present

## 2019-06-28 DIAGNOSIS — G4733 Obstructive sleep apnea (adult) (pediatric): Secondary | ICD-10-CM | POA: Diagnosis not present

## 2019-07-05 DIAGNOSIS — Z1159 Encounter for screening for other viral diseases: Secondary | ICD-10-CM | POA: Diagnosis not present

## 2019-07-05 DIAGNOSIS — N6321 Unspecified lump in the left breast, upper outer quadrant: Secondary | ICD-10-CM | POA: Diagnosis not present

## 2019-07-05 DIAGNOSIS — S2232XA Fracture of one rib, left side, initial encounter for closed fracture: Secondary | ICD-10-CM | POA: Diagnosis not present

## 2019-07-14 DIAGNOSIS — N6489 Other specified disorders of breast: Secondary | ICD-10-CM | POA: Diagnosis not present

## 2019-07-14 DIAGNOSIS — R928 Other abnormal and inconclusive findings on diagnostic imaging of breast: Secondary | ICD-10-CM | POA: Diagnosis not present

## 2019-07-14 DIAGNOSIS — N6321 Unspecified lump in the left breast, upper outer quadrant: Secondary | ICD-10-CM | POA: Diagnosis not present

## 2019-07-18 DIAGNOSIS — E119 Type 2 diabetes mellitus without complications: Secondary | ICD-10-CM | POA: Diagnosis not present

## 2019-07-18 DIAGNOSIS — E109 Type 1 diabetes mellitus without complications: Secondary | ICD-10-CM | POA: Diagnosis not present

## 2019-07-18 DIAGNOSIS — Z794 Long term (current) use of insulin: Secondary | ICD-10-CM | POA: Diagnosis not present

## 2019-07-29 DIAGNOSIS — G4733 Obstructive sleep apnea (adult) (pediatric): Secondary | ICD-10-CM | POA: Diagnosis not present

## 2019-08-03 DIAGNOSIS — H5015 Alternating exotropia: Secondary | ICD-10-CM | POA: Diagnosis not present

## 2019-08-04 DIAGNOSIS — Z1159 Encounter for screening for other viral diseases: Secondary | ICD-10-CM | POA: Diagnosis not present

## 2019-08-04 DIAGNOSIS — S2231XA Fracture of one rib, right side, initial encounter for closed fracture: Secondary | ICD-10-CM | POA: Diagnosis not present

## 2019-08-05 DIAGNOSIS — E119 Type 2 diabetes mellitus without complications: Secondary | ICD-10-CM | POA: Diagnosis not present

## 2019-08-05 DIAGNOSIS — R0781 Pleurodynia: Secondary | ICD-10-CM | POA: Diagnosis not present

## 2019-08-05 DIAGNOSIS — Z03818 Encounter for observation for suspected exposure to other biological agents ruled out: Secondary | ICD-10-CM | POA: Diagnosis not present

## 2019-08-05 DIAGNOSIS — R05 Cough: Secondary | ICD-10-CM | POA: Diagnosis not present

## 2019-08-05 DIAGNOSIS — J40 Bronchitis, not specified as acute or chronic: Secondary | ICD-10-CM | POA: Diagnosis not present

## 2019-08-05 DIAGNOSIS — I10 Essential (primary) hypertension: Secondary | ICD-10-CM | POA: Diagnosis not present

## 2019-08-08 DIAGNOSIS — J410 Simple chronic bronchitis: Secondary | ICD-10-CM | POA: Diagnosis not present

## 2019-08-11 DIAGNOSIS — G4733 Obstructive sleep apnea (adult) (pediatric): Secondary | ICD-10-CM | POA: Diagnosis not present

## 2019-08-15 DIAGNOSIS — E1169 Type 2 diabetes mellitus with other specified complication: Secondary | ICD-10-CM | POA: Diagnosis not present

## 2019-08-18 DIAGNOSIS — E109 Type 1 diabetes mellitus without complications: Secondary | ICD-10-CM | POA: Diagnosis not present

## 2019-08-18 DIAGNOSIS — Z794 Long term (current) use of insulin: Secondary | ICD-10-CM | POA: Diagnosis not present

## 2019-08-21 ENCOUNTER — Emergency Department (HOSPITAL_BASED_OUTPATIENT_CLINIC_OR_DEPARTMENT_OTHER): Payer: Medicare Other

## 2019-08-21 ENCOUNTER — Emergency Department (HOSPITAL_BASED_OUTPATIENT_CLINIC_OR_DEPARTMENT_OTHER)
Admission: EM | Admit: 2019-08-21 | Discharge: 2019-08-21 | Disposition: A | Discharge status: Home / Self Care | Payer: Medicare Other | Attending: Emergency Medicine | Admitting: Emergency Medicine

## 2019-08-21 ENCOUNTER — Encounter (HOSPITAL_BASED_OUTPATIENT_CLINIC_OR_DEPARTMENT_OTHER): Payer: Self-pay | Admitting: Emergency Medicine

## 2019-08-21 ENCOUNTER — Other Ambulatory Visit: Payer: Self-pay

## 2019-08-21 DIAGNOSIS — E119 Type 2 diabetes mellitus without complications: Secondary | ICD-10-CM | POA: Insufficient documentation

## 2019-08-21 DIAGNOSIS — B349 Viral infection, unspecified: Secondary | ICD-10-CM | POA: Insufficient documentation

## 2019-08-21 DIAGNOSIS — R0602 Shortness of breath: Secondary | ICD-10-CM | POA: Diagnosis not present

## 2019-08-21 DIAGNOSIS — F17200 Nicotine dependence, unspecified, uncomplicated: Secondary | ICD-10-CM | POA: Diagnosis not present

## 2019-08-21 DIAGNOSIS — Z79899 Other long term (current) drug therapy: Secondary | ICD-10-CM | POA: Diagnosis not present

## 2019-08-21 DIAGNOSIS — Z794 Long term (current) use of insulin: Secondary | ICD-10-CM | POA: Insufficient documentation

## 2019-08-21 DIAGNOSIS — R079 Chest pain, unspecified: Secondary | ICD-10-CM | POA: Diagnosis not present

## 2019-08-21 DIAGNOSIS — R062 Wheezing: Secondary | ICD-10-CM | POA: Insufficient documentation

## 2019-08-21 DIAGNOSIS — I10 Essential (primary) hypertension: Secondary | ICD-10-CM | POA: Diagnosis not present

## 2019-08-21 DIAGNOSIS — R05 Cough: Secondary | ICD-10-CM | POA: Diagnosis not present

## 2019-08-21 HISTORY — DX: Essential (primary) hypertension: I10

## 2019-08-21 HISTORY — DX: Nonpsychotic mental disorder, unspecified: F48.9

## 2019-08-21 HISTORY — DX: Type 2 diabetes mellitus without complications: E11.9

## 2019-08-21 LAB — CBC WITH DIFFERENTIAL/PLATELET
Abs Immature Granulocytes: 0.03 10*3/uL (ref 0.00–0.07)
Basophils Absolute: 0.1 10*3/uL (ref 0.0–0.1)
Basophils Relative: 1 %
Eosinophils Absolute: 0.2 10*3/uL (ref 0.0–0.5)
Eosinophils Relative: 2 %
HCT: 36.6 % (ref 36.0–46.0)
Hemoglobin: 11.4 g/dL — ABNORMAL LOW (ref 12.0–15.0)
Immature Granulocytes: 0 %
Lymphocytes Relative: 28 %
Lymphs Abs: 2.9 10*3/uL (ref 0.7–4.0)
MCH: 24.2 pg — ABNORMAL LOW (ref 26.0–34.0)
MCHC: 31.1 g/dL (ref 30.0–36.0)
MCV: 77.5 fL — ABNORMAL LOW (ref 80.0–100.0)
Monocytes Absolute: 0.9 10*3/uL (ref 0.1–1.0)
Monocytes Relative: 8 %
Neutro Abs: 6.2 10*3/uL (ref 1.7–7.7)
Neutrophils Relative %: 61 %
Platelets: 314 10*3/uL (ref 150–400)
RBC: 4.72 MIL/uL (ref 3.87–5.11)
RDW: 15.4 % (ref 11.5–15.5)
WBC: 10.2 10*3/uL (ref 4.0–10.5)
nRBC: 0 % (ref 0.0–0.2)

## 2019-08-21 LAB — URINALYSIS, ROUTINE W REFLEX MICROSCOPIC
Bilirubin Urine: NEGATIVE
Glucose, UA: NEGATIVE mg/dL
Ketones, ur: NEGATIVE mg/dL
Leukocytes,Ua: NEGATIVE
Nitrite: NEGATIVE
Protein, ur: >300 mg/dL — AB
Specific Gravity, Urine: 1.02 (ref 1.005–1.030)
pH: 6 (ref 5.0–8.0)

## 2019-08-21 LAB — URINALYSIS, MICROSCOPIC (REFLEX)

## 2019-08-21 LAB — COMPREHENSIVE METABOLIC PANEL
ALT: 37 U/L (ref 0–44)
AST: 31 U/L (ref 15–41)
Albumin: 3.4 g/dL — ABNORMAL LOW (ref 3.5–5.0)
Alkaline Phosphatase: 51 U/L (ref 38–126)
Anion gap: 14 (ref 5–15)
BUN: 14 mg/dL (ref 6–20)
CO2: 22 mmol/L (ref 22–32)
Calcium: 8.8 mg/dL — ABNORMAL LOW (ref 8.9–10.3)
Chloride: 99 mmol/L (ref 98–111)
Creatinine, Ser: 0.99 mg/dL (ref 0.44–1.00)
GFR calc Af Amer: >60 mL/min (ref >60)
GFR calc non Af Amer: >60 mL/min (ref >60)
Glucose, Bld: 202 mg/dL — ABNORMAL HIGH (ref 70–99)
Potassium: 3.5 mmol/L (ref 3.5–5.1)
Sodium: 135 mmol/L (ref 135–145)
Total Bilirubin: 0.3 mg/dL (ref 0.3–1.2)
Total Protein: 6.7 g/dL (ref 6.5–8.1)

## 2019-08-21 LAB — PREGNANCY, URINE: Preg Test, Ur: NEGATIVE

## 2019-08-21 LAB — CBG MONITORING, ED: Glucose-Capillary: 189 mg/dL — ABNORMAL HIGH (ref 70–99)

## 2019-08-21 MED ORDER — SODIUM CHLORIDE 0.9 % IV BOLUS
1000.0000 mL | Freq: Once | INTRAVENOUS | Status: AC
  Administered 2019-08-21: 1000 mL via INTRAVENOUS

## 2019-08-21 MED ORDER — ALBUTEROL SULFATE HFA 108 (90 BASE) MCG/ACT IN AERS
INHALATION_SPRAY | RESPIRATORY_TRACT | Status: AC
  Administered 2019-08-21: 4 via RESPIRATORY_TRACT
  Filled 2019-08-21: qty 6.7

## 2019-08-21 MED ORDER — AEROCHAMBER PLUS FLO-VU MISC
1.0000 | Freq: Once | Status: AC
  Administered 2019-08-21: 1
  Filled 2019-08-21: qty 1

## 2019-08-21 MED ORDER — ALBUTEROL SULFATE HFA 108 (90 BASE) MCG/ACT IN AERS
4.0000 | INHALATION_SPRAY | Freq: Once | RESPIRATORY_TRACT | Status: AC
  Administered 2019-08-21: 19:00:00 4 via RESPIRATORY_TRACT

## 2019-08-21 NOTE — ED Notes (Signed)
Pt reports 4 fractured ribs in Jan and states developed cough at that time. Newest symptoms started within last 2 weeks including "confusion, exhaustion and weakness". Pt denies fever, c/o N/V, last emesis Friday. Pt denies diarrhea

## 2019-08-21 NOTE — ED Notes (Signed)
CBG 189. 

## 2019-08-21 NOTE — Discharge Instructions (Addendum)
If you develop high fever, vomiting, chest pain, trouble breathing, or any other new/concerning symptoms then return to the ER for evalution

## 2019-08-21 NOTE — ED Provider Notes (Signed)
[facility] EMERGENCY DEPARTMENT Provider Note   CSN: [id number] Arrival date & time: 08/21/19  1807     History   Chief Complaint Chief Complaint  Patient presents with  . multiple complaints    HPI Mary Mack is a 33 y.o. female.     HPI  34 year old female presents with multiple complaints.  Has been feeling poorly for about 2 weeks.  This includes cough, headache, fatigue, shortness of breath.  Has been having the cough and chest pain for months since she broke ribs coughing.  That type of chest pain and the cough are pretty similar.  She states she has been tested for the novel coronavirus during this time and was negative.  No fevers.  She was placed on steroids and azithromycin with no relief.  She has had wheezing and has a history of asthma but has not been taking her inhaler like she thinks she is supposed to.  She only takes about once a day or so because it makes her jittery.  Past Medical History:  Diagnosis Date  . Diabetes mellitus without complication (HCC)   . Hypertension   . Mental health problem     There are no active problems to display for this patient.      OB History   No obstetric history on file.      Home Medications    Prior to Admission medications   Medication Sig Start Date End Date Taking? Authorizing Provider  amLODipine (NORVASC) 5 MG tablet Take by mouth. 05/21/19  Yes [provider]  ARIPiprazole Lauroxil ER 1064 MG/3.9ML PRSY Inject into the muscle. 06/07/18  Yes [provider]  buPROPion (WELLBUTRIN XL) 150 MG 24 hr tablet Take 150 mg by mouth daily.   Yes [provider]  busPIRone (BUSPAR) 10 MG tablet Take by mouth. 11/25/18  Yes [provider]  FLUoxetine (PROZAC) 20 MG capsule Take by mouth. 06/23/18  Yes [provider]  insulin degludec (TRESIBA FLEXTOUCH) 100 UNIT/ML SOPN FlexTouch Pen INJECT 52 UNITS SUBCUTANEOUSLY DAILY 12/17/18  Yes [provider]   insulin lispro (HUMALOG) 100 UNIT/ML KwikPen Inject as directed per sliding scale up to max dose of 50 units/day 08/11/19  Yes [provider]  lurasidone (LATUDA) 40 MG TABS tablet Take by mouth. 11/25/18  Yes [provider]  metFORMIN (GLUCOPHAGE-XR) 500 MG 24 hr tablet TAKE 1 TABLET BY MOUTH TWICE A DAY WITH MEALS 08/09/19  Yes [provider]  pregabalin (LYRICA) 75 MG capsule Take by mouth. 11/29/18  Yes [provider]  Semaglutide,0.25 or 0.5MG /DOS, (OZEMPIC, 0.25 OR 0.5 MG/DOSE,) 2 MG/1.5ML SOPN Inject 0.25 mg SQ weekly x 4 weeks, then 0.5 mg SQ weekly. 05/21/19  Yes [provider]    Family History No family history on file.  Social History Social History   Tobacco Use  . Smoking status: Current Every Day Smoker  . Smokeless tobacco: Never Used  Substance Use Topics  . Alcohol use: Not Currently  . Drug use: Never     Allergies   Ciprofloxacin and Vancomycin   Review of Systems Review of Systems  Constitutional: Positive for fatigue. Negative for fever.  Respiratory: Positive for cough, shortness of breath and wheezing.   Cardiovascular: Positive for chest pain. Negative for leg swelling.  Gastrointestinal: Negative for abdominal pain and vomiting.  Genitourinary: Negative for dysuria.  Neurological: Positive for headaches.  All other systems reviewed and are negative.    Physical Exam Updated Vital  Signs BP (!) 146/94   Pulse 83   Temp 98.7 F (37.1 C) (Oral)   Resp 13   Ht 5\' 5"  (1.651 m)   Wt 135.2 kg   SpO2 97%   BMI 49.59 kg/m   Physical Exam Vitals signs and nursing note reviewed.  Constitutional:      Appearance: She is well-developed. She is obese.  HENT:     Head: Normocephalic and atraumatic.     Right Ear: External ear normal.     Left Ear: External ear normal.     Nose: Nose normal.  Eyes:     General:        Right eye: No discharge.        Left eye: No discharge.  Cardiovascular:     Rate  and Rhythm: Normal rate and regular rhythm.     Heart sounds: Normal heart sounds.  Pulmonary:     Effort: Pulmonary effort is normal. No tachypnea or accessory muscle usage.     Breath sounds: Wheezing present.  Abdominal:     Palpations: Abdomen is soft.     Tenderness: There is no abdominal tenderness.  Skin:    General: Skin is warm and dry.  Neurological:     Mental Status: She is alert.  Psychiatric:        Mood and Affect: Mood is not anxious.      ED Treatments / Results  Labs (all labs ordered are listed, but only abnormal results are displayed) Labs Reviewed  COMPREHENSIVE METABOLIC PANEL - Abnormal; Notable for the following components:      Result Value   Glucose, Bld 202 (*)    Calcium 8.8 (*)    Albumin 3.4 (*)    All other components within normal limits  CBC WITH DIFFERENTIAL/PLATELET - Abnormal; Notable for the following components:   Hemoglobin 11.4 (*)    MCV 77.5 (*)    MCH 24.2 (*)    All other components within normal limits  URINALYSIS, ROUTINE W REFLEX MICROSCOPIC - Abnormal; Notable for the following components:   Hgb urine dipstick SMALL (*)    Protein, ur >300 (*)    All other components within normal limits  URINALYSIS, MICROSCOPIC (REFLEX) - Abnormal; Notable for the following components:   Bacteria, UA FEW (*)    All other components within normal limits  CBG MONITORING, ED - Abnormal; Notable for the following components:   Glucose-Capillary 189 (*)    All other components within normal limits  PREGNANCY, URINE    EKG EKG Interpretation  Date/Time:  Sunday August 21 2019 18:56:10 EDT Ventricular Rate:  80 PR Interval:    QRS Duration: 100 QT Interval:  382 QTC Calculation: 441 R Axis:   90 Text Interpretation:  Sinus rhythm Borderline right axis deviation Borderline T abnormalities, inferior leads Baseline wander in lead(s) I III aVR aVL V1 No old tracing to compare Confirmed by Sherwood Gambler [id number]) on 08/21/2019 9:05:46 PM    Radiology Dg Chest Portable 1 View  Result Date: 08/21/2019 CLINICAL DATA:  Cough. Fatigue. Headache. EXAM: PORTABLE CHEST 1 VIEW COMPARISON:  Radiograph 08/05/2019, chest CTA 03/13/2019 at [place]: The cardiomediastinal contours are normal. Similar bronchial thickening to prior exam. Improved right basilar atelectasis. Pulmonary vasculature is normal. No consolidation, pleural effusion, or pneumothorax. No acute osseous abnormalities are seen. IMPRESSION: 1. Unchanged bronchial thickening since 08/05/2019. No new abnormality. 2. Improved right basilar atelectasis. Electronically Signed   By: Aurther Loft.D.  On: 08/21/2019 19:39    Procedures Procedures (including critical care time)  Medications Ordered in ED Medications  sodium chloride 0.9 % bolus 1,000 mL (1,000 mLs Intravenous New Bag/Given 08/21/19 1923)  albuterol (VENTOLIN HFA) 108 (90 Base) MCG/ACT inhaler 4 puff (4 puffs Inhalation Given 08/21/19 1849)  aerochamber plus with mask device 1 each (1 each Other Given 08/21/19 1851)     Initial Impression / Assessment and Plan / ED Course  I have reviewed the triage vital signs and the nursing notes.  Pertinent labs & imaging results that were available during my care of the patient were reviewed by me and considered in my medical decision making (see chart for details).        Patient with multiple nonspecific complaints.  Likely of viral illness.  No pneumonia.  She does have some wheezing and was given albuterol.  However given her reportedly poorly controlled diabetes and already having a steroid prescription, I do not think further steroids are needed.  She is in no distress.  She was given some fluids and currently is feeling a little better.  No overt sign of bacterial infection.  She was already tested for the novel coronavirus.  Appears stable for discharge home with supportive care and follow-up with PCP.  Final Clinical Impressions(s) / ED  Diagnoses   Final diagnoses:  Viral illness    ED Discharge Orders    None       Pricilla LovelessGoldston, Sayed Apostol, MD 08/21/19 2114

## 2019-08-21 NOTE — ED Triage Notes (Signed)
Pt presents with multiple complaints consisting of fatigue, cough, nausea, headache, loss of appetite, and confusion x 2 weeks.

## 2019-08-21 NOTE — ED Notes (Signed)
XR at bedside

## 2019-08-21 NOTE — ED Notes (Signed)
ED Provider at bedside. 

## 2019-08-21 NOTE — ED Notes (Signed)
Patient aware we need a urine sample. Will ring call bell.

## 2019-08-21 NOTE — ED Notes (Signed)
Pt reports being treated with prednisone and abx 10 days ago. Pt states "I dont feel any better"

## 2019-08-24 DIAGNOSIS — E1169 Type 2 diabetes mellitus with other specified complication: Secondary | ICD-10-CM | POA: Diagnosis not present

## 2019-09-23 DIAGNOSIS — M8448XA Pathological fracture, other site, initial encounter for fracture: Secondary | ICD-10-CM

## 2019-09-23 HISTORY — DX: Pathological fracture, other site, initial encounter for fracture: M84.48XA

## 2019-10-23 DIAGNOSIS — Z20828 Contact with and (suspected) exposure to other viral communicable diseases: Secondary | ICD-10-CM | POA: Diagnosis not present

## 2019-11-16 IMAGING — DX DG CHEST 1V PORT
1 series · 1 of 1 positions shown · non-contrast
Comparison: Radiograph 08/05/2019, chest CTA 03/13/2019 at [place]
Claire

CLINICAL DATA: Cough. Fatigue. Headache.

EXAM:
PORTABLE CHEST 1 VIEW

[chest ap]
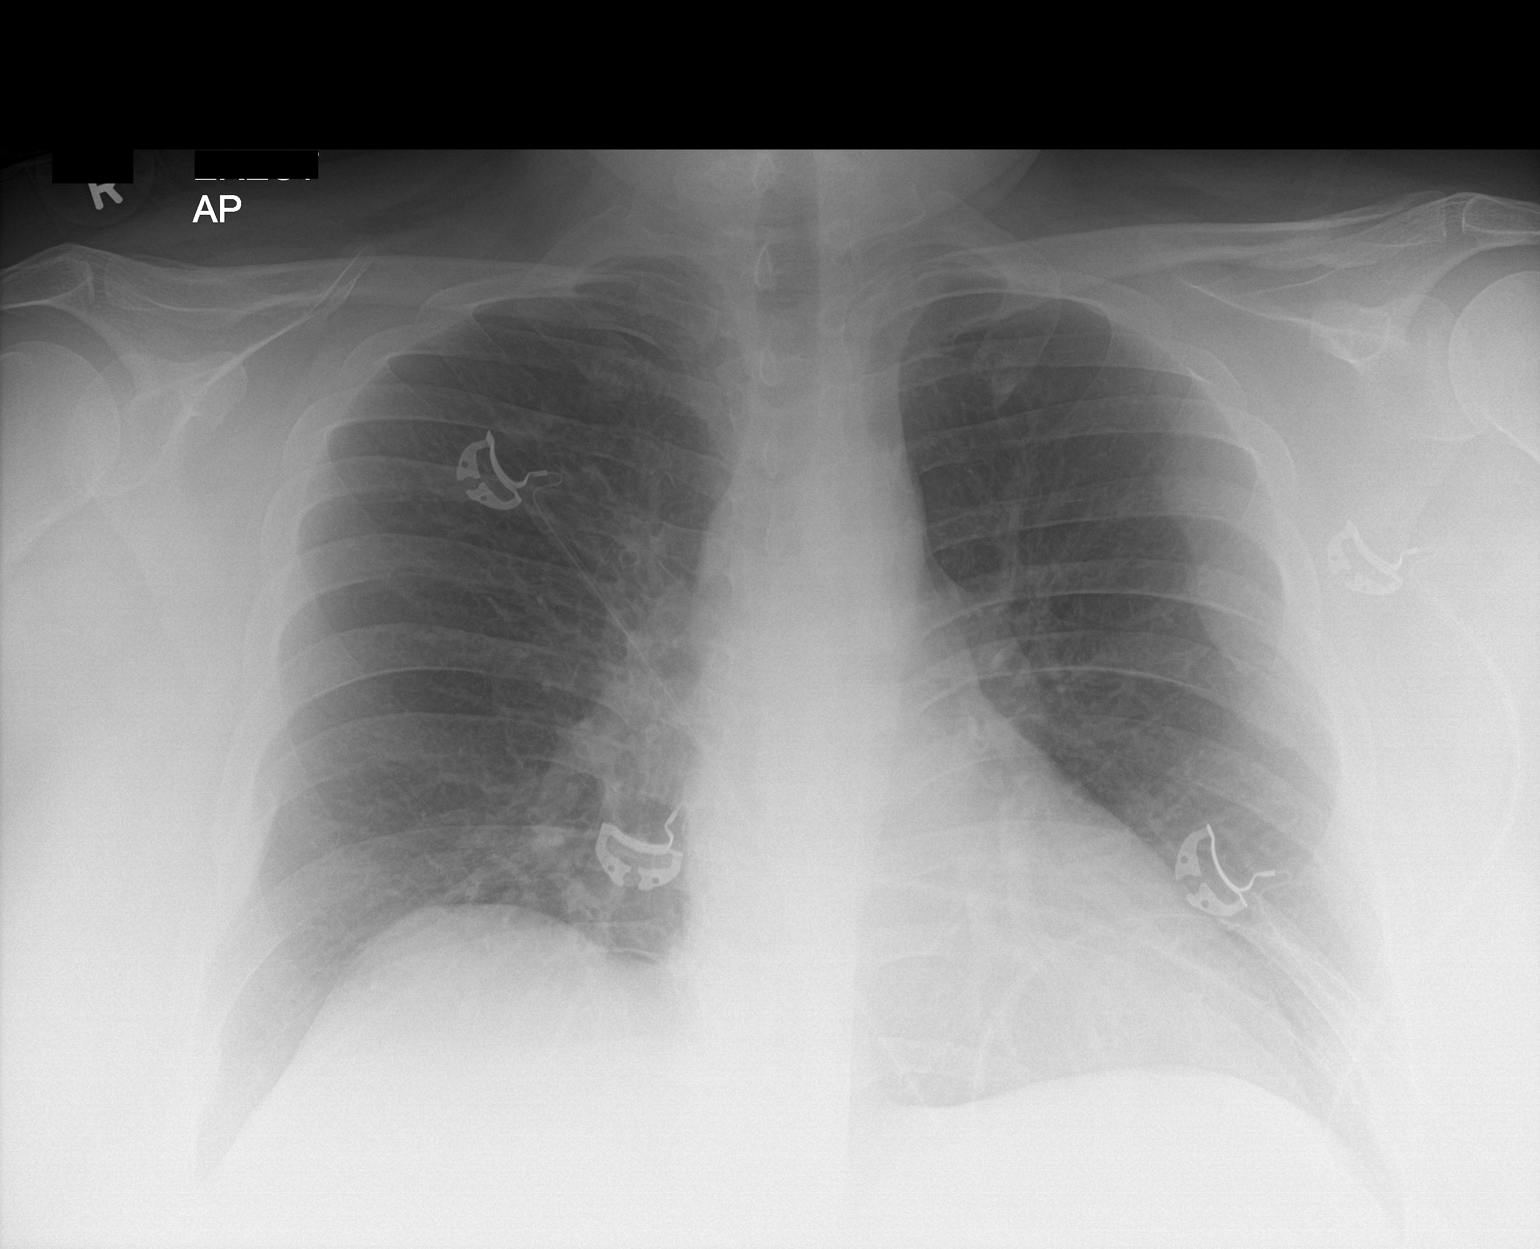

[1 of 1 positions shown; findings below may reference images not displayed]

FINDINGS: The cardiomediastinal contours are normal. Similar bronchial
thickening to prior exam. Improved right basilar atelectasis.
Pulmonary vasculature is normal. No consolidation, pleural effusion,
or pneumothorax. No acute osseous abnormalities are seen.
IMPRESSION: 1. Unchanged bronchial thickening since 08/05/2019. No new
abnormality.
2. Improved right basilar atelectasis.

## 2019-12-21 DIAGNOSIS — R053 Chronic cough: Secondary | ICD-10-CM | POA: Insufficient documentation

## 2019-12-21 HISTORY — DX: Chronic cough: R05.3

## 2020-01-23 DIAGNOSIS — M898X9 Other specified disorders of bone, unspecified site: Secondary | ICD-10-CM | POA: Insufficient documentation

## 2020-01-23 HISTORY — DX: Other specified disorders of bone, unspecified site: M89.8X9

## 2020-03-25 DIAGNOSIS — F3132 Bipolar disorder, current episode depressed, moderate: Secondary | ICD-10-CM | POA: Insufficient documentation

## 2020-03-25 HISTORY — DX: Bipolar disorder, current episode depressed, moderate: F31.32

## 2020-04-12 DIAGNOSIS — D500 Iron deficiency anemia secondary to blood loss (chronic): Secondary | ICD-10-CM | POA: Insufficient documentation

## 2020-04-12 HISTORY — DX: Iron deficiency anemia secondary to blood loss (chronic): D50.0

## 2020-10-11 HISTORY — DX: Morbid (severe) obesity due to excess calories: E66.01

## 2020-11-13 DIAGNOSIS — S2243XD Multiple fractures of ribs, bilateral, subsequent encounter for fracture with routine healing: Secondary | ICD-10-CM

## 2020-11-13 HISTORY — DX: Multiple fractures of ribs, bilateral, subsequent encounter for fracture with routine healing: S22.43XD

## 2020-11-26 DIAGNOSIS — R0602 Shortness of breath: Secondary | ICD-10-CM

## 2020-11-26 HISTORY — DX: Shortness of breath: R06.02

## 2020-12-05 DIAGNOSIS — J1282 Pneumonia due to coronavirus disease 2019: Secondary | ICD-10-CM

## 2020-12-05 DIAGNOSIS — U071 COVID-19: Secondary | ICD-10-CM

## 2020-12-05 HISTORY — DX: Pneumonia due to coronavirus disease 2019: J12.82

## 2020-12-05 HISTORY — DX: COVID-19: U07.1

## 2020-12-07 DIAGNOSIS — N179 Acute kidney failure, unspecified: Secondary | ICD-10-CM

## 2020-12-07 HISTORY — DX: Acute kidney failure, unspecified: N17.9

## 2020-12-20 DIAGNOSIS — I429 Cardiomyopathy, unspecified: Secondary | ICD-10-CM | POA: Insufficient documentation

## 2020-12-20 HISTORY — DX: Cardiomyopathy, unspecified: I42.9

## 2020-12-21 DIAGNOSIS — R072 Precordial pain: Secondary | ICD-10-CM

## 2020-12-21 DIAGNOSIS — R9431 Abnormal electrocardiogram [ECG] [EKG]: Secondary | ICD-10-CM

## 2020-12-21 HISTORY — DX: Precordial pain: R07.2

## 2020-12-21 HISTORY — DX: Abnormal electrocardiogram (ECG) (EKG): R94.31

## 2021-05-10 ENCOUNTER — Other Ambulatory Visit: Payer: Self-pay | Admitting: Legal Medicine

## 2021-05-13 ENCOUNTER — Other Ambulatory Visit: Payer: Self-pay | Admitting: Legal Medicine

## 2021-05-26 DIAGNOSIS — E871 Hypo-osmolality and hyponatremia: Secondary | ICD-10-CM | POA: Insufficient documentation

## 2021-05-26 DIAGNOSIS — R739 Hyperglycemia, unspecified: Secondary | ICD-10-CM

## 2021-05-26 DIAGNOSIS — E875 Hyperkalemia: Secondary | ICD-10-CM | POA: Insufficient documentation

## 2021-05-26 HISTORY — DX: Hyperglycemia, unspecified: R73.9

## 2021-05-26 HISTORY — DX: Hyperkalemia: E87.5

## 2021-05-26 HISTORY — DX: Hypocalcemia: E83.51

## 2021-05-26 HISTORY — DX: Hypo-osmolality and hyponatremia: E87.1

## 2021-09-21 ENCOUNTER — Ambulatory Visit (HOSPITAL_COMMUNITY)
Admission: RE | Admit: 2021-09-21 | Discharge: 2021-09-21 | Disposition: A | Discharge status: Home / Self Care | Payer: Medicare Other | Attending: Psychiatry | Admitting: Psychiatry

## 2021-09-21 ENCOUNTER — Emergency Department (HOSPITAL_COMMUNITY): Payer: Medicare Other

## 2021-09-21 ENCOUNTER — Emergency Department (HOSPITAL_COMMUNITY)
Admission: EM | Admit: 2021-09-21 | Discharge: 2021-09-23 | Disposition: A | Discharge status: Home / Self Care | Payer: Medicare Other | Attending: Emergency Medicine | Admitting: Emergency Medicine

## 2021-09-21 ENCOUNTER — Other Ambulatory Visit: Payer: Self-pay

## 2021-09-21 DIAGNOSIS — Z20822 Contact with and (suspected) exposure to covid-19: Secondary | ICD-10-CM | POA: Diagnosis not present

## 2021-09-21 DIAGNOSIS — Z7984 Long term (current) use of oral hypoglycemic drugs: Secondary | ICD-10-CM | POA: Insufficient documentation

## 2021-09-21 DIAGNOSIS — Z79899 Other long term (current) drug therapy: Secondary | ICD-10-CM | POA: Diagnosis not present

## 2021-09-21 DIAGNOSIS — E119 Type 2 diabetes mellitus without complications: Secondary | ICD-10-CM | POA: Diagnosis not present

## 2021-09-21 DIAGNOSIS — I509 Heart failure, unspecified: Secondary | ICD-10-CM | POA: Diagnosis not present

## 2021-09-21 DIAGNOSIS — Z794 Long term (current) use of insulin: Secondary | ICD-10-CM | POA: Insufficient documentation

## 2021-09-21 DIAGNOSIS — I10 Essential (primary) hypertension: Secondary | ICD-10-CM | POA: Insufficient documentation

## 2021-09-21 DIAGNOSIS — R45851 Suicidal ideations: Secondary | ICD-10-CM

## 2021-09-21 DIAGNOSIS — I110 Hypertensive heart disease with heart failure: Secondary | ICD-10-CM | POA: Diagnosis not present

## 2021-09-21 DIAGNOSIS — F603 Borderline personality disorder: Secondary | ICD-10-CM | POA: Diagnosis present

## 2021-09-21 DIAGNOSIS — F322 Major depressive disorder, single episode, severe without psychotic features: Secondary | ICD-10-CM | POA: Diagnosis present

## 2021-09-21 DIAGNOSIS — F1910 Other psychoactive substance abuse, uncomplicated: Secondary | ICD-10-CM | POA: Clinically undetermined

## 2021-09-21 DIAGNOSIS — F17210 Nicotine dependence, cigarettes, uncomplicated: Secondary | ICD-10-CM | POA: Insufficient documentation

## 2021-09-21 LAB — RESP PANEL BY RT-PCR (FLU A&B, COVID) ARPGX2
Influenza A by PCR: NEGATIVE
Influenza B by PCR: NEGATIVE
SARS Coronavirus 2 by RT PCR: NEGATIVE

## 2021-09-21 LAB — CBC WITH DIFFERENTIAL/PLATELET
Abs Immature Granulocytes: 0.04 10*3/uL (ref 0.00–0.07)
Basophils Absolute: 0.1 10*3/uL (ref 0.0–0.1)
Basophils Relative: 1 %
Eosinophils Absolute: 0.2 10*3/uL (ref 0.0–0.5)
Eosinophils Relative: 2 %
HCT: 41.9 % (ref 36.0–46.0)
Hemoglobin: 14.3 g/dL (ref 12.0–15.0)
Immature Granulocytes: 1 %
Lymphocytes Relative: 27 %
Lymphs Abs: 2.4 10*3/uL (ref 0.7–4.0)
MCH: 28.1 pg (ref 26.0–34.0)
MCHC: 34.1 g/dL (ref 30.0–36.0)
MCV: 82.3 fL (ref 80.0–100.0)
Monocytes Absolute: 0.7 10*3/uL (ref 0.1–1.0)
Monocytes Relative: 8 %
Neutro Abs: 5.5 10*3/uL (ref 1.7–7.7)
Neutrophils Relative %: 61 %
Platelets: 334 10*3/uL (ref 150–400)
RBC: 5.09 MIL/uL (ref 3.87–5.11)
RDW: 13.3 % (ref 11.5–15.5)
WBC: 8.9 10*3/uL (ref 4.0–10.5)
nRBC: 0 % (ref 0.0–0.2)

## 2021-09-21 LAB — URINALYSIS, ROUTINE W REFLEX MICROSCOPIC
Bilirubin Urine: NEGATIVE
Glucose, UA: >=500 mg/dL — AB
Ketones, ur: NEGATIVE mg/dL
Leukocytes,Ua: NEGATIVE
Nitrite: NEGATIVE
Protein, ur: >=300 mg/dL — AB
Specific Gravity, Urine: 1.024 (ref 1.005–1.030)
pH: 6 (ref 5.0–8.0)

## 2021-09-21 LAB — COMPREHENSIVE METABOLIC PANEL
ALT: 16 U/L (ref 0–44)
AST: 19 U/L (ref 15–41)
Albumin: 2.4 g/dL — ABNORMAL LOW (ref 3.5–5.0)
Alkaline Phosphatase: 60 U/L (ref 38–126)
Anion gap: 8 (ref 5–15)
BUN: 18 mg/dL (ref 6–20)
CO2: 24 mmol/L (ref 22–32)
Calcium: 8.9 mg/dL (ref 8.9–10.3)
Chloride: 102 mmol/L (ref 98–111)
Creatinine, Ser: 1.61 mg/dL — ABNORMAL HIGH (ref 0.44–1.00)
GFR, Estimated: 42 mL/min — ABNORMAL LOW (ref >60)
Glucose, Bld: 333 mg/dL — ABNORMAL HIGH (ref 70–99)
Potassium: 3.6 mmol/L (ref 3.5–5.1)
Sodium: 134 mmol/L — ABNORMAL LOW (ref 135–145)
Total Bilirubin: 0.3 mg/dL (ref 0.3–1.2)
Total Protein: 6.1 g/dL — ABNORMAL LOW (ref 6.5–8.1)

## 2021-09-21 LAB — BRAIN NATRIURETIC PEPTIDE: B Natriuretic Peptide: 61.7 pg/mL (ref 0.0–100.0)

## 2021-09-21 LAB — RAPID URINE DRUG SCREEN, HOSP PERFORMED
Amphetamines: NOT DETECTED
Barbiturates: NOT DETECTED
Benzodiazepines: NOT DETECTED
Cocaine: NOT DETECTED
Opiates: NOT DETECTED
Tetrahydrocannabinol: POSITIVE — AB

## 2021-09-21 LAB — PREGNANCY, URINE: Preg Test, Ur: NEGATIVE

## 2021-09-21 LAB — ETHANOL: Alcohol, Ethyl (B): <10 mg/dL (ref <10)

## 2021-09-21 MED ORDER — FLUOXETINE HCL 10 MG PO CAPS: 10.0000 mg | ORAL_CAPSULE | Freq: Every day | ORAL | Status: DC

## 2021-09-21 MED ORDER — OLANZAPINE 5 MG PO TBDP
5.0000 mg | ORAL_TABLET | Freq: Three times a day (TID) | ORAL | Status: DC | PRN
  Administered 2021-09-23: 5 mg via ORAL
  Filled 2021-09-21: qty 1

## 2021-09-21 MED ORDER — INSULIN LISPRO (1 UNIT DIAL) 100 UNIT/ML (KWIKPEN): 10.0000 [IU] | PEN_INJECTOR | Freq: Three times a day (TID) | SUBCUTANEOUS | Status: DC

## 2021-09-21 MED ORDER — LORAZEPAM 1 MG PO TABS
1.0000 mg | ORAL_TABLET | ORAL | Status: AC | PRN
  Administered 2021-09-22: 1 mg via ORAL
  Filled 2021-09-21: qty 1

## 2021-09-21 MED ORDER — BUSPIRONE HCL 10 MG PO TABS: 10.0000 mg | ORAL_TABLET | Freq: Two times a day (BID) | ORAL | Status: DC

## 2021-09-21 MED ORDER — INSULIN ASPART 100 UNIT/ML IJ SOLN
0.0000 [IU] | Freq: Three times a day (TID) | INTRAMUSCULAR | Status: DC
  Administered 2021-09-22: 4 [IU] via SUBCUTANEOUS
  Administered 2021-09-22: 7 [IU] via SUBCUTANEOUS
  Administered 2021-09-22: 15 [IU] via SUBCUTANEOUS
  Administered 2021-09-23: 7 [IU] via SUBCUTANEOUS
  Administered 2021-09-23: 11 [IU] via SUBCUTANEOUS
  Filled 2021-09-21: qty 0.2

## 2021-09-21 MED ORDER — INSULIN ASPART 100 UNIT/ML IJ SOLN
0.0000 [IU] | Freq: Every day | INTRAMUSCULAR | Status: DC
  Administered 2021-09-22: 5 [IU] via SUBCUTANEOUS
  Administered 2021-09-22: 2 [IU] via SUBCUTANEOUS
  Filled 2021-09-21: qty 0.05

## 2021-09-21 MED ORDER — ZIPRASIDONE MESYLATE 20 MG IM SOLR
20.0000 mg | INTRAMUSCULAR | Status: DC | PRN
Start: 2021-09-21 — End: 2021-09-23

## 2021-09-21 MED ORDER — BUSPIRONE HCL 10 MG PO TABS
30.0000 mg | ORAL_TABLET | Freq: Three times a day (TID) | ORAL | Status: DC
  Administered 2021-09-22 – 2021-09-23 (×5): 30 mg via ORAL
  Filled 2021-09-21 (×5): qty 3

## 2021-09-21 MED ORDER — PREGABALIN 50 MG PO CAPS: 75.0000 mg | ORAL_CAPSULE | Freq: Two times a day (BID) | ORAL | Status: DC

## 2021-09-21 MED ORDER — AMLODIPINE BESYLATE 5 MG PO TABS
10.0000 mg | ORAL_TABLET | Freq: Every day | ORAL | Status: DC
  Administered 2021-09-22 – 2021-09-23 (×3): 10 mg via ORAL
  Filled 2021-09-21 (×3): qty 2

## 2021-09-21 MED ORDER — METFORMIN HCL ER 500 MG PO TB24: 500.0000 mg | ORAL_TABLET | Freq: Two times a day (BID) | ORAL | Status: DC

## 2021-09-21 MED ORDER — LURASIDONE HCL 40 MG PO TABS: 40.0000 mg | ORAL_TABLET | Freq: Every day | ORAL | Status: DC

## 2021-09-21 MED ORDER — BUPROPION HCL ER (XL) 150 MG PO TB24: 150.0000 mg | ORAL_TABLET | Freq: Every day | ORAL | Status: DC

## 2021-09-21 MED ORDER — INSULIN DEGLUDEC 100 UNIT/ML ~~LOC~~ SOPN: 52.0000 [IU] | PEN_INJECTOR | Freq: Every day | SUBCUTANEOUS | Status: DC

## 2021-09-21 NOTE — ED Provider Notes (Signed)
[facility]-EMERGENCY DEPT Provider Note   CSN: [id number] Arrival date & time: 09/21/21  1637     History No chief complaint on file.   Mary Mack is a 35 y.o. female.  HPI Patient sent from behavioral health for medical clearance.  Patient acknowledges multiple medical issues, but denies recent diabetes, hypertension. She notes a history of depression, is currently complaining of suicidal ideation, stating that she is alive.  She reported this concern for behavioral health colleagues at their facility, but with her medical issues she was sent here for clearance. Patient has baseline dyspnea unchanged, has no new fever, has no new fall, has no new pain.    Past Medical History:  Diagnosis Date   Diabetes mellitus without complication (HCC)    Hypertension    Mental health problem       OB History   No obstetric history on file.     No family history on file.  Social History   Tobacco Use   Smoking status: Every Day   Smokeless tobacco: Never  Substance Use Topics   Alcohol use: Not Currently   Drug use: Never    Home Medications Prior to Admission medications   Medication Sig Start Date End Date Taking? Authorizing Provider  amLODipine (NORVASC) 5 MG tablet Take by mouth. 05/21/19   [provider]  ARIPiprazole Lauroxil ER 1064 MG/3.9ML PRSY Inject into the muscle. 06/07/18   [provider]  buPROPion (WELLBUTRIN XL) 150 MG 24 hr tablet Take 150 mg by mouth daily.    [provider]  busPIRone (BUSPAR) 10 MG tablet Take by mouth. 11/25/18   [provider]  FLUoxetine (PROZAC) 20 MG capsule Take by mouth. 06/23/18   [provider]  insulin degludec (TRESIBA FLEXTOUCH) 100 UNIT/ML SOPN FlexTouch Pen INJECT 52 UNITS SUBCUTANEOUSLY DAILY 12/17/18   [provider]  insulin lispro (HUMALOG) 100 UNIT/ML KwikPen Inject as directed per sliding scale up to max dose of 50 units/day 08/11/19    [provider]  lurasidone (LATUDA) 40 MG TABS tablet Take by mouth. 11/25/18   [provider]  metFORMIN (GLUCOPHAGE-XR) 500 MG 24 hr tablet TAKE 1 TABLET BY MOUTH TWICE A DAY WITH MEALS 08/09/19   [provider]  pregabalin (LYRICA) 75 MG capsule Take by mouth. 11/29/18   [provider]  Semaglutide,0.25 or 0.5MG /DOS, (OZEMPIC, 0.25 OR 0.5 MG/DOSE,) 2 MG/1.5ML SOPN Inject 0.25 mg SQ weekly x 4 weeks, then 0.5 mg SQ weekly. 05/21/19   [provider]    Allergies    Ciprofloxacin and Vancomycin  Review of Systems   Review of Systems  Constitutional:        Per HPI, otherwise negative  HENT:         Per HPI, otherwise negative  Respiratory:         Per HPI, otherwise negative  Cardiovascular:        Per HPI, otherwise negative  Gastrointestinal:  Negative for vomiting.  Endocrine:       Negative aside from HPI  Genitourinary:        Neg aside from HPI   Musculoskeletal:        Per HPI, otherwise negative  Skin: Negative.   Neurological:  Negative for syncope.  Psychiatric/Behavioral:  Positive for suicidal ideas.    Physical Exam Updated Vital Signs BP (!) 197/126   Pulse 92   Temp 98.8 F (37.1 C) (Oral)   Resp 20  Ht 5\' 5"  (1.651 m)   Wt (!) 168.7 kg   SpO2 94%   BMI 61.90 kg/m   Physical Exam Vitals and nursing note reviewed.  Constitutional:      Appearance: She is well-developed. She is obese. She is ill-appearing.  HENT:     Head: Normocephalic and atraumatic.  Eyes:     Conjunctiva/sclera: Conjunctivae normal.  Cardiovascular:     Rate and Rhythm: Normal rate and regular rhythm.  Pulmonary:     Effort: Pulmonary effort is normal. No respiratory distress.     Breath sounds: Normal breath sounds. No stridor.     Comments: Increased work of breathing, though with resting this improved substantially. Abdominal:     General: There is no distension.  Skin:    General: Skin is warm and dry.  Neurological:      Mental Status: She is alert and oriented to person, place, and time.     Cranial Nerves: No cranial nerve deficit.  Psychiatric:        Thought Content: Thought content includes suicidal ideation. Thought content does not include suicidal plan.        Cognition and Memory: Cognition is not impaired. Memory is not impaired.    ED Results / Procedures / Treatments   Labs (all labs ordered are listed, but only abnormal results are displayed) Labs Reviewed  COMPREHENSIVE METABOLIC PANEL - Abnormal; Notable for the following components:      Result Value   Sodium 134 (*)    Glucose, Bld 333 (*)    Creatinine, Ser 1.61 (*)    Total Protein 6.1 (*)    Albumin 2.4 (*)    GFR, Estimated 42 (*)    All other components within normal limits  URINALYSIS, ROUTINE W REFLEX MICROSCOPIC - Abnormal; Notable for the following components:   APPearance HAZY (*)    Glucose, UA >=500 (*)    Hgb urine dipstick MODERATE (*)    Protein, ur >=300 (*)    Bacteria, UA RARE (*)    All other components within normal limits  RAPID URINE DRUG SCREEN, HOSP PERFORMED - Abnormal; Notable for the following components:   Tetrahydrocannabinol POSITIVE (*)    All other components within normal limits  RESP PANEL BY RT-PCR (FLU A&B, COVID) ARPGX2  ETHANOL  BRAIN NATRIURETIC PEPTIDE  CBC WITH DIFFERENTIAL/PLATELET      Radiology DG Chest 2 View  Result Date: 09/21/2021 CLINICAL DATA:  Cough.  CHF. EXAM: CHEST - 2 VIEW COMPARISON:  Radiograph 03/18/2020 FINDINGS: Lung volumes are low. The heart is normal in size. There is mild chronic peribronchial thickening. No pulmonary edema. No pleural effusion or pneumothorax. There are remote bilateral rib fractures. Exaggerated thoracic kyphosis. No acute osseous abnormalities are seen. IMPRESSION: 1. Low lung volumes with mild chronic peribronchial thickening. No acute findings. 2. Remote bilateral rib fractures, not significantly changed from prior. Electronically Signed    By: 03/20/2020 M.D.   On: 09/21/2021 18:20    Procedures Procedures   Medications Ordered in ED Medications - No data to display  ED Course  I have reviewed the triage vital signs and the nursing notes.  Pertinent labs & imaging results that were available during my care of the patient were reviewed by me and considered in my medical decision making (see chart for details).  Patient with no hypoxia, x-ray without pulmonary congestion.  Though she does have a history of heart failure BNP is unremarkable.  She has slight elevation  in creatinine, though her last value was about 2 years ago, and BUN is normal suggesting chronic changes. Patient has morbid obesity, likely complicating her respiratory function, already made worse by her CHF.  However, the patient has no evidence for acute new phenomena, including pneumonia, bacteremia, sepsis.  Has mild hyperglycemia, but no anion gap, low suspicion for DKA.  Patient medically cleared for behavioral health placement. MDM Rules/Calculators/A&P MDM Number of Diagnoses or Management Options Suicidal ideation: new, needed workup   Amount and/or Complexity of Data Reviewed Clinical lab tests: ordered and reviewed Tests in the radiology section of CPT: ordered and reviewed Tests in the medicine section of CPT: reviewed and ordered Decide to obtain previous medical records or to obtain history from someone other than the patient: yes Obtain history from someone other than the patient: yes Review and summarize past medical records: yes Independent visualization of images, tracings, or specimens: yes  Risk of Complications, Morbidity, and/or Mortality Presenting problems: high Diagnostic procedures: high Management options: high  Critical Care Total time providing critical care: < 30 minutes  Patient Progress Patient progress: stable   Final Clinical Impression(s) / ED Diagnoses Final diagnoses:  Suicidal ideation     Gerhard Munch, MD 09/21/21 [phone]

## 2021-09-21 NOTE — H&P (Signed)
Behavioral Health Medical Screening Exam  Mary Mack is an 35 y.o. female with a past psychiatric history of depression, substance abuse, borderline personality disorder, schizoaffective disorder (self-reported); CHF, type 2 diabetes, recurrent pneumonia, and autoimmune disorder who presented to [facility] voluntarily with her mother for an assessment of depression and suicidal thoughts with plan to overdose via insulin.   Patient reports poor sleep, anhedonia, increased feelings of guilt and worthlessness, low energy, poor concentration, increased appetite and suicidal ideations; she denies any psychomotor changes. States she attempted to overdose with her insulin yesterday and "it didn't work". She endorses increased thoughts of suicide related to chronic illnesses; states she is "tired" and "wants it all to be over". Protective factors include family and religious beliefs; states "they don't matter anymore". She lives in the home with her fiance (schizophrenia) and two cats, rats. Says she has an outpatient psychiatrist who was referring her for Ketamine therapy but her insurance doesn't cover it; no therapist. She is unable to contract for safety.   Mom endorses increased decline in daughter's mental health over the past two years since medical health declined. She states she no longer feels patient is safe at home since learning of the attempted overdose and feels she needs more help.   Total Time spent with patient: 20 minutes  Psychiatric Specialty Exam: Physical Exam Vitals and nursing note reviewed.  Constitutional:      Appearance: She is obese.  HENT:     Head: Normocephalic.     Nose: Nose normal.     Mouth/Throat:     Mouth: Mucous membranes are moist.     Pharynx: Oropharynx is clear.  Eyes:     Pupils: Pupils are equal, round, and reactive to light.  Cardiovascular:     Rate and Rhythm: Tachycardia present.     Pulses: Normal pulses.  Pulmonary:     Effort: Pulmonary effort  is normal.  Skin:    General: Skin is warm and dry.  Neurological:     Mental Status: She is alert. Mental status is at baseline.  Psychiatric:        Attention and Perception: Attention and perception normal.        Mood and Affect: Mood is depressed.        Speech: Speech normal.        Behavior: Behavior is cooperative.        Thought Content: Thought content includes suicidal ideation. Thought content includes suicidal plan.        Cognition and Memory: Cognition and memory normal.        Judgment: Judgment is impulsive.   Review of Systems  Psychiatric/Behavioral:  Positive for dysphoric mood and suicidal ideas.   All other systems reviewed and are negative. There were no vitals taken for this visit.There is no height or weight on file to calculate BMI. General Appearance: Casual Eye Contact:  Good Speech:  Clear and Coherent Volume:  Normal Mood:  Hopeless and Worthless Affect:  Labile and Tearful Thought Process:  Linear Orientation:  Full (Time, Place, and Person) Thought Content:  Logical Suicidal Thoughts:  Yes.  with intent/plan Homicidal Thoughts:  No Memory:  Immediate;   Good Recent;   Good Judgement:  Good Insight:  Shallow Psychomotor Activity:  Decreased Concentration: Concentration: Fair and Attention Span: Fair Recall:  Good Fund of Knowledge:Good Language: Fair Akathisia:  NA Handed:   AIMS (if indicated):    Assets:  Communication Skills Desire for Improvement Financial Resources/Insurance Housing  Intimacy Resilience Social Cabin crew Sleep:     Musculoskeletal: Strength & Muscle Tone: decreased Gait & Station: broad based Patient leans: N/A  There were no vitals taken for this visit.  Recommendations: Based on my evaluation the patient does not appear to have an emergency medical condition. [facility] full; patient transferred to ED for medical clearance. Report called to [place], MD   Loletta Parish, NP 09/21/2021, 4:02 PM

## 2021-09-21 NOTE — ED Triage Notes (Signed)
Pt came from [facility] for med clearance, pt states she is tired and knows she is dying and does not want to be a burden to her family. Pt very upset about weight and heart and health issues. States she has broken ribs and pneumonia. Pt stated she attempted to overdose on insulin.

## 2021-09-21 NOTE — Progress Notes (Signed)
Pts mother returned to [facility] lobby to meet with [facility]. States patient is calling mother to come pick her up and patient has changed her mind about seeking treatment. Mother willing to IVC patient if pt not IVC'd in ED.  [facility] NP states patient had expressed suicidal ideation during the assessment and could not keep herself safe. Patient had expressed desire for psychiatric treatment to this writer, stating "I need to be here".   Rosey Bath RN

## 2021-09-21 NOTE — BH Assessment (Signed)
Clinician made contact with pt's nurse, Morrell Riddle RN, at 2025 in regards to putting the Tele-Assessment machine in pt's room to complete her MH Assessment. At 2136 pt's nurse responded that pt is too sleepy to participate at this time. TTS to attempt assessment at a later time.

## 2021-09-22 DIAGNOSIS — F603 Borderline personality disorder: Secondary | ICD-10-CM

## 2021-09-22 DIAGNOSIS — F322 Major depressive disorder, single episode, severe without psychotic features: Secondary | ICD-10-CM

## 2021-09-22 DIAGNOSIS — R45851 Suicidal ideations: Secondary | ICD-10-CM

## 2021-09-22 DIAGNOSIS — F1910 Other psychoactive substance abuse, uncomplicated: Secondary | ICD-10-CM

## 2021-09-22 HISTORY — DX: Borderline personality disorder: F60.3

## 2021-09-22 HISTORY — DX: Other psychoactive substance abuse, uncomplicated: F19.10

## 2021-09-22 HISTORY — DX: Suicidal ideations: R45.851

## 2021-09-22 HISTORY — DX: Major depressive disorder, single episode, severe without psychotic features: F32.2

## 2021-09-22 LAB — CBG MONITORING, ED
Glucose-Capillary: 198 mg/dL — ABNORMAL HIGH (ref 70–99)
Glucose-Capillary: 217 mg/dL — ABNORMAL HIGH (ref 70–99)
Glucose-Capillary: 248 mg/dL — ABNORMAL HIGH (ref 70–99)
Glucose-Capillary: 312 mg/dL — ABNORMAL HIGH (ref 70–99)
Glucose-Capillary: 411 mg/dL — ABNORMAL HIGH (ref 70–99)

## 2021-09-22 LAB — HEMOGLOBIN A1C
Hgb A1c MFr Bld: 12.7 % — ABNORMAL HIGH (ref 4.8–5.6)
Mean Plasma Glucose: 317.79 mg/dL

## 2021-09-22 MED ORDER — FAMOTIDINE IN NACL 20-0.9 MG/50ML-% IV SOLN
20.0000 mg | Freq: Once | INTRAVENOUS | Status: AC
  Administered 2021-09-22: 20 mg via INTRAVENOUS
  Filled 2021-09-22 (×2): qty 50

## 2021-09-22 MED ORDER — DIPHENHYDRAMINE HCL 50 MG/ML IJ SOLN
25.0000 mg | Freq: Once | INTRAMUSCULAR | Status: AC
  Administered 2021-09-22: 25 mg via INTRAVENOUS
  Filled 2021-09-22: qty 1

## 2021-09-22 NOTE — ED Provider Notes (Signed)
Emergency Medicine Observation Re-evaluation Note  Mary Mack is a 35 y.o. female, seen on rounds today at 0730.  Pt initially presented to the ED for complaints of No chief complaint on file. Currently, the patient is resting comfortably.  Physical Exam  BP (!) 175/89 (BP Location: Right Arm)   Pulse (!) 113   Temp 98.3 F (36.8 C) (Oral)   Resp 20   Ht 5\' 5"  (1.651 m)   Wt (!) 168.7 kg   SpO2 90%   BMI 61.90 kg/m  Physical Exam General: NAD   ED Course / MDM  EKG:   I have reviewed the labs performed to date as well as medications administered while in observation.  Recent changes in the last 24 hours include reported episode of mild periorbital edema.  This has resolved overnight.  AM nursing staff also report that the patient has significant desaturations with sleeping.  Patient has a reported history of sleep apnea and reports that she is supposed to be using CPAP for sleep.  We will hopefully arrange CPAP for use while sleeping.  Plan  Current plan is for psych evaluation and placement.  Mary Mack is not under involuntary commitment.     Shawn Route, MD 09/22/21 [phone]

## 2021-09-22 NOTE — Progress Notes (Signed)
Tiffany with [facility] contacted CSW in regards to this patient. Tiffany asked for nursing notes. CSW will fax additional nursing notes to facilitate possible placement.  Crissie Reese, MSW, LCSW-A, LCAS-A Phone: [phone] Disposition/TOC

## 2021-09-22 NOTE — Progress Notes (Signed)
Raven with [facility] asked to be provided the patient's nurse contact information to review this patient further with the nurse. CS provided contact information to the nurse.   Crissie Reese, MSW, LCSW-A, LCAS-A Phone: [phone] Disposition/TOC

## 2021-09-22 NOTE — Progress Notes (Signed)
CSW faxed nursing notes to facility, [facility].  Crissie Reese, MSW, LCSW-A, LCAS-A Phone: [phone] Disposition/TOC

## 2021-09-22 NOTE — Progress Notes (Signed)
Tiffany with [facility] admission contacted CSW in reference to nursing notes. CSW re-faxed referral which includes nursing notes for review.   Crissie Reese, MSW, LCSW-A, LCAS-A Phone: [phone] Disposition/TOC

## 2021-09-22 NOTE — Progress Notes (Signed)
Pt placed on CPAP auto with 0 bleed in. Pt on machine for rest.

## 2021-09-22 NOTE — Consult Note (Signed)
Mary Mack is a 35 year old female with a past psychiatric history of borderline personality disorder, anxiety, and depression (possible schizoaffective disorder per patient report) who initially presented to [facility] after failed suicide attempt via insulin and suicidal ideations. Patient was transferred to [facility] for medical clearance; since ED admission patient has wavered on her symptoms and need for help. Inconsistent historian.   Patient presents laying in bed resting. Patient states she is feeling "somewhat better but her suicidal thoughts come and go". She continues to endorse hopelessness and "wanting to die", "wanting it to be over". She wavers on actual intent and plan; admits having access to means (insulin) and attempting suicide day before yesterday ("days run together") via insulin. Visible mood lability; tearful at times during assessment. Provider discussed inpatient treatment and patient verbalized agreeing to "getting help" and inpatient services. Unable to safety plan. She did inquire about possible length of stay in which provider notified her was contingent upon her symptoms. She denies any homicidal ideations, auditory or visual hallucinations, and does not appear to be responding to any external/internal stimuli at this time. She is currently under review at several facilities and agrees to inpatient treatment.   Per EDRN note 09/21/21 1730: "Pt came from [facility] for med clearance, pt states she is tired and knows she is dying and does not want to be a burden to her family. Pt very upset about weight and heart and health issues. States she has broken ribs and pneumonia. Pt stated she attempted to overdose on insulin."  Per [facility] RN note 09/21/21 1713: "Pts mother returned to [facility] lobby to meet with [facility]. States patient is calling mother to come pick her up and patient has changed her mind about seeking treatment. Mother willing to IVC patient if pt not IVC'd in ED.  [facility] NP states patient had  expressed suicidal ideation during the assessment and could not keep herself safe. Patient had expressed desire for psychiatric treatment to this writer, stating "I need to be here".  Plan:   -Continue to seek inpatient psychiatric placement for further  observation, stabilization, and treatment.   -Continue Buspar 30 mg daily

## 2021-09-22 NOTE — ED Notes (Addendum)
Pt care taken, pt is resting now, no complaints at this time

## 2021-09-22 NOTE — ED Provider Notes (Signed)
Asked by nursing staff to assess patient for eye swelling and edema.  This was new since she arrived in the ED last evening.  She endorses itchiness and soreness but no change in vision. She has chronic difficulty breathing which is unchanged.  No tongue or lip swelling.  Bilateral periorbital edema and ecchymosis. No significant erythema. No tongue or lip swelling.  Patient will be given antihistamines.  Will avoid steroids given her hyperglycemia. She denies any new medications or other exposures. States similar symptoms several days ago which resolved on their own   Glynn Octave, MD 09/22/21 [phone]

## 2021-09-22 NOTE — ED Notes (Signed)
Boyfriend at bedside and getting pt credit card from pt .
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
## 2021-09-22 NOTE — Progress Notes (Signed)
Rt called to room WA 31 for pt de-sating while sleeping. RT saw MD in hall and suggested CPAP. When RT was going to placed pt on machine pt was going to have a video meeting with MD. Pt was on room air, no distress noted and sats 97% when awake. Rt will come back at a later time for CPAP if still needed.

## 2021-09-22 NOTE — Progress Notes (Signed)
Per Barron Schmid secure chat, patient meets criteria for inpatient treatment. There are no available or appropriate beds at [facility] today. CSW faxed referrals to the following facilities for review:  [facility]  Pending - No Request Sent N/A [address], [place] [address] [phone] --  [facility]  Pending - No Request Sent N/A [address], [place] [address] [phone] --  [facility]  Pending - No Request [facility] [address] [place] [address] [phone] --  [facility]  Pending - No Request Sent N/A [address] [place] [address] [phone] --  [facility]  Pending - No Request Sent N/A [address] [place], [place] [address] [phone] --  [facility]  Pending - No Request Sent N/A [address] [place] [address] [phone] --  [facility]  Pending - No Request Sent N/A [address] [place] [address] [phone] --  [facility]  Pending - No Request Sent N/A [address] [place] [address] [phone] --  [facility]  Pending - No Request Sent N/A [address] [place] [address] [phone] --  [facility]  Pending - No Request Sent N/A [address] [place] [address] [phone] --  [facility]  Pending - No Request Sent N/A [address] [place] [address] [phone] --  [facility]  Pending - No Request Sent N/A [address] [place] [address] [phone] --  [facility]  Pending - No Request Sent N/A [address]., [place] [address] [phone] --  [facility]  Pending - No Request Sent N/A [address] [place] [address] [phone] --  [facility]  Pending - No Request Sent N/A [address], [place] [address] [phone] --  [facility]  Pending - No Request Sent N/A [address] [place] [address] [phone] --    TTS will continue to seek bed placement.  Crissie Reese, MSW, LCSW-A, LCAS-A Phone: [phone] Disposition/TOC

## 2021-09-22 NOTE — ED Notes (Signed)
Nurse asked Rancour, MD to come look at patient. Patient is reporting feeling bad and both of her eyes are swollen.

## 2021-09-22 NOTE — ED Notes (Signed)
Pt O2 sats 77-82 while sleeping. MD and respiratory aware. MD at bedside.

## 2021-09-23 DIAGNOSIS — F322 Major depressive disorder, single episode, severe without psychotic features: Secondary | ICD-10-CM | POA: Diagnosis not present

## 2021-09-23 LAB — CBG MONITORING, ED
Glucose-Capillary: 227 mg/dL — ABNORMAL HIGH (ref 70–99)
Glucose-Capillary: 269 mg/dL — ABNORMAL HIGH (ref 70–99)

## 2021-09-23 MED ORDER — IBUPROFEN 200 MG PO TABS
600.0000 mg | ORAL_TABLET | Freq: Once | ORAL | Status: AC
  Administered 2021-09-23: 600 mg via ORAL
  Filled 2021-09-23: qty 3

## 2021-09-23 NOTE — ED Notes (Signed)
Call from Ruel Favors  at [facility] behavioral health requesting information on Ms [facility] behavior of the last 1 to 2 days. After brief conversation Luisa Hart stated after reviewing information with his provider he would call with their decision.

## 2021-09-23 NOTE — ED Notes (Signed)
Bishop Dublin (Mother) [phone]

## 2021-09-23 NOTE — BH Assessment (Signed)
[facility]HH Assessment Progress Note   Per Dorena Bodo, NP, this voluntary pt does not require psychiatric hospitalization at this time.  Pt is psychiatrically cleared.  Discharge instructions include referral information for several area outpatient providers, as well as two Partial Hospitalization Programs for pt follow up at her initiative.  EDP Gwyneth Sprout, MD and pt's nurse, Casimiro Needle, have been notified.  Doylene Canning, MA Triage Specialist [phone]

## 2021-09-23 NOTE — ED Provider Notes (Addendum)
Emergency Medicine Observation Re-evaluation Note  Mary Mack is a 35 y.o. female, seen on rounds today.  Pt initially presented to the ED for complaints of Psychiatric Evaluation Currently, the patient is sleeping.  Physical Exam  BP (!) 180/97 (BP Location: Left Arm)   Pulse 82   Temp 97.6 F (36.4 C) (Oral)   Resp 20   Ht 5\' 5"  (1.651 m)   Wt (!) 168.7 kg   SpO2 96%   BMI 61.90 kg/m  Physical Exam General: Sleeping Cardiac: Regular rate Lungs: Clear without respiratory distress Psych: Sleeping  ED Course / MDM  EKG:   I have reviewed the labs performed to date as well as medications administered while in observation.  Recent changes in the last 24 hours include no events over the last 24 hours.  Plan  Current plan is cleared by psychiatry.  She was given outpt resources.  She did contract for safety.  Mary Mack is not under involuntary commitment.     Shawn Route, MD 09/23/21 09/25/21    [id number], MD 09/23/21 1404

## 2021-09-23 NOTE — Discharge Instructions (Addendum)
For your behavioral health needs you are advised to follow up with an outpatient provider.  Contact one of the providers listed below at your earliest opportunity to schedule an intake appointment:       [facility]      [address]      [place], [place] [address]      [phone]       [facility]      [address]      [place], [place] [address]      [phone]       [facility]      [address] [place], [place] [address]      [phone]       [facility]      [address]      [place], [place] [address]      [phone]  Another option to consider is a Partial Hospitalization Program (PHP).  These programs meet for four or five hours a day, five days a week, and they include group and individual therapy, psychiatry/medication management, and case management to prepare for step-down to routine outpatient treatment.  Participants are usually in this program for about two weeks.  For more information, or to enroll in PHP, contact one of the following providers:       [facility]. [address]      [place], [place] [address]      Contact person: Donia Guiles, LCSW      [phone]      Due to Covid-19 this program is currently virtual       [facility]      [address]      [place], [place] [address]      [phone]      This program is in-person

## 2021-09-23 NOTE — Consult Note (Signed)
[facility] ED Discharge  09/23/2021 1:47 PM OLYMPIA THISSEN  MRN:  [id number]  Method of visit?: Virtual (Location of provider: [facility] Location of patient: [facility] Names and roles of anyone participating in the consult/assessment: Mary [facility], patient; Dorena Bodo, NP; Dr. Delanna Notice, psychiatry  This service was provided via telemedicine using a 2-way, interactive audio, and video technology. )  Principal Problem: Severe major depression [facility]) Discharge Diagnoses: Principal Problem:   Severe major depression (HCC) Active Problems:   Borderline personality disorder in adult [place] [facility])   Polysubstance abuse (HCC)   Suicidal ideations   Subjective: per admit note:  Mary Mack is a 35 year old female with a past psychiatric history of borderline personality disorder, anxiety, and depression (possible schizoaffective disorder per patient report) who initially presented to [facility] after failed suicide attempt via insulin and suicidal ideations. Patient was transferred to [facility] for medical clearance; since ED admission patient has wavered on her symptoms and need for help. Inconsistent historian.    Patient presents laying in bed resting. Patient states she is feeling "somewhat better but her suicidal thoughts come and go". She continues to endorse hopelessness and "wanting to die", "wanting it to be over". She wavers on actual intent and plan; admits having access to means (insulin) and attempting suicide day before yesterday ("days run together") via insulin. Visible mood lability; tearful at times during assessment. Provider discussed inpatient treatment and patient verbalized agreeing to "getting help" and inpatient services. Unable to safety plan. She did inquire about possible length of stay in which provider notified her was contingent upon her symptoms. She denies any homicidal ideations, auditory or visual hallucinations, and does not appear to be responding to any external/internal stimuli at this  time. She is currently under review at several facilities and agrees to inpatient treatment.    Today, patient is seen via tele psych by this provider while at [facility]. Patient reports she attempted to kill herself one week ago by injecting insulin, but she no longer feels suicidal and wishes to go home. "I am very angry and want to go home"  when asked why she is angry, she reports she was told she was a voluntary patient but if she tries to leave she will be IVC'd. She reports she is uncomfortable at the [facility]. "I have a lot of health problems and I'm sick of being sick, but I do not want to kill myself". I just want to go home and get better. Patient reports that prior to coming into the [facility], she was ready to go for a walk-in intake with [facility] and then decided to come to [facility] instead. She reports she has not felt suicidal since speaking with the provider at [facility] when she came in. She is able to contract for safety and able to tell this provider what steps she would take if she started feeling suicidal.   She lives with fiance, and considers her mother and fiance to be her support. Mother lives 9 minutes away from patient.   Denies suicidal ideation, no intent, no plan, denies homicidal ideation, denies auditory and visual hallucinations.  Does not appear to be responding to internal and external stimuli. She is able to contract for safety and gives this provider permission to speak with her Marchia Meiers Mary Mack # [phone], whom she lives with.   Collateral:  spoke with Mary Mack, who is supportive for this patient to return home if she is no longer feeling suicidal, and that she is  taking her medications.  Safety planning reviewed with Mary Mack and he understands that if the patient started feeling suicidal to call 911/988/ go to the nearest emergency facility. Patient also able to tell this provider about the [facility] facility in [place] and aware that the facility is open 24/7 for emergency  situations.     Total Time spent with patient: 30 minutes  Past Psychiatric History: borderline personality disorder  Past Medical History:  Past Medical History:  Diagnosis Date   Diabetes mellitus without complication (HCC)    Hypertension    Mental health problem     Family History: No family history on file. Family Psychiatric  History: unknown Social History:  Social History   Substance and Sexual Activity  Alcohol Use Not Currently     Social History   Substance and Sexual Activity  Drug Use Never    Social History   Socioeconomic History   Marital status: Single    Spouse name: Not on file   Number of children: Not on file   Years of education: Not on file   Highest education level: Not on file  Occupational History   Not on file  Tobacco Use   Smoking status: Every Day   Smokeless tobacco: Never  Substance and Sexual Activity   Alcohol use: Not Currently   Drug use: Never   Sexual activity: Not on file  Other Topics Concern   Not on file  Social History Narrative   Not on file   Social Determinants of Health   Financial Resource Strain: Not on file  Food Insecurity: Not on file  Transportation Needs: Not on file  Physical Activity: Not on file  Stress: Not on file  Social Connections: Not on file    Tobacco Cessation:  N/A, patient does not currently use tobacco products  Current Medications: Current Facility-Administered Medications  Medication Dose Route Frequency Provider Last Rate Last Admin   amLODipine (NORVASC) tablet 10 mg  10 mg Oral Daily Gerhard Munch, MD   10 mg at 09/23/21 1138   busPIRone (BUSPAR) tablet 30 mg  30 mg Oral TID Gerhard Munch, MD   30 mg at 09/23/21 0826   insulin aspart (novoLOG) injection 0-20 Units  0-20 Units Subcutaneous TID WC Gerhard Munch, MD   11 Units at 09/23/21 1139   insulin aspart (novoLOG) injection 0-5 Units  0-5 Units Subcutaneous QHS Gerhard Munch, MD   2 Units at 09/22/21 2136    OLANZapine zydis (ZYPREXA) disintegrating tablet 5 mg  5 mg Oral Q8H PRN Gerhard Munch, MD   5 mg at 09/23/21 1138   And   ziprasidone (GEODON) injection 20 mg  20 mg Intramuscular PRN Gerhard Munch, MD       Current Outpatient Medications  Medication Sig Dispense Refill   albuterol (VENTOLIN HFA) 108 (90 Base) MCG/ACT inhaler Inhale 2 puffs into the lungs every 6 (six) hours as needed for wheezing or shortness of breath.     atorvastatin (LIPITOR) 40 MG tablet Take 40 mg by mouth daily.     busPIRone (BUSPAR) 30 MG tablet Take 30 mg by mouth in the morning, at noon, and at bedtime.     carvedilol (COREG) 25 MG tablet Take 25 mg by mouth 2 (two) times daily with a meal.     clonazePAM (KLONOPIN) 0.5 MG tablet Take 0.5 mg by mouth at bedtime.     furosemide (LASIX) 20 MG tablet Take 20 mg by mouth 2 (two) times daily.  NIFEDIPINE ER OSMOTIC RELEASE PO Take 60 mg by mouth daily.     ondansetron (ZOFRAN) 4 MG tablet Take 4 mg by mouth 4 (four) times daily as needed for nausea or vomiting.     oxcarbazepine (TRILEPTAL) 600 MG tablet Take 600 mg by mouth 3 (three) times daily.     pantoprazole (PROTONIX) 40 MG tablet Take 40 mg by mouth daily before breakfast.     sacubitril-valsartan (ENTRESTO) 24-26 MG Take 1 tablet by mouth 2 (two) times daily.     ziprasidone (GEODON) 40 MG capsule Take 40 mg by mouth 2 (two) times daily with a meal.     amLODipine (NORVASC) 5 MG tablet Take by mouth. (Patient not taking: No sig reported)     buPROPion (WELLBUTRIN XL) 150 MG 24 hr tablet Take 150 mg by mouth daily. (Patient not taking: No sig reported)     busPIRone (BUSPAR) 10 MG tablet Take by mouth. (Patient not taking: No sig reported)     FLUoxetine (PROZAC) 20 MG capsule Take by mouth. (Patient not taking: No sig reported)     insulin degludec (TRESIBA FLEXTOUCH) 100 UNIT/ML SOPN FlexTouch Pen INJECT 52 UNITS SUBCUTANEOUSLY DAILY (Patient not taking: No sig reported)     insulin lispro (HUMALOG)  100 UNIT/ML KwikPen Inject as directed per sliding scale up to max dose of 50 units/day (Patient not taking: No sig reported)     lurasidone (LATUDA) 40 MG TABS tablet Take by mouth. (Patient not taking: No sig reported)     metFORMIN (GLUCOPHAGE-XR) 500 MG 24 hr tablet TAKE 1 TABLET BY MOUTH TWICE A DAY WITH MEALS (Patient not taking: No sig reported)     pregabalin (LYRICA) 75 MG capsule Take by mouth. (Patient not taking: No sig reported)     PTA Medications: (Not in a [facility] admission)   Musculoskeletal: Strength & Muscle Tone: within normal limits Gait & Station: normal Patient leans: N/A  Psychiatric Specialty Exam:  Presentation  General Appearance: Casual  Eye Contact:Fair  Speech:Clear and Coherent  Speech Volume:Normal  Handedness:No data recorded  Mood and Affect  Mood:Dysphoric  Affect:Blunt   Thought Process  Thought Processes:Coherent  Descriptions of Associations:Intact  Orientation:Full (Time, Place and Person)  Thought Content:Scattered; Logical  History of Schizophrenia/Schizoaffective disorder:No  Duration of Psychotic Symptoms:No data recorded Hallucinations:Hallucinations: None  Ideas of Reference:None  Suicidal Thoughts:Suicidal Thoughts: Yes, Passive SI Active Intent and/or Plan: With Access to Means; With Means to Carry Out (inconsistent historian) SI Passive Intent and/or Plan: With Access to Means; With Means to Carry Out; With Plan  Homicidal Thoughts:Homicidal Thoughts: No   Sensorium  Memory:Immediate Fair; Recent Fair; Remote Fair  Judgment:Other (comment) (inconsistent historian)  Insight:Shallow   Executive Functions  Concentration:Fair  Attention Span:Fair  Recall:Fair  [employer] of Knowledge:Fair  Language:Fair   Psychomotor Activity  Psychomotor Activity:Psychomotor Activity: Normal   Assets  Assets:Resilience; Social Support; Vocational/Educational; Health and safety inspector; Housing;  Intimacy   Sleep  Sleep:Sleep: Fair    Physical Exam: Physical Exam Nursing note reviewed.  Constitutional:      General: She is not in acute distress. Cardiovascular:     Rate and Rhythm: Normal rate.  Pulmonary:     Effort: Pulmonary effort is normal.  Neurological:     Mental Status: She is alert and oriented to person, place, and time.  Psychiatric:        Attention and Perception: Attention normal.        Mood and Affect: Mood is depressed.  Speech: Speech normal.        Behavior: Behavior is cooperative.        Thought Content: Thought content is not paranoid or delusional. Thought content does not include homicidal or suicidal ideation. Thought content does not include homicidal or suicidal plan.   Review of Systems  Constitutional:  Negative for chills and fever.  Respiratory:  Negative for shortness of breath.   Cardiovascular:  Negative for chest pain.  Gastrointestinal:  Negative for abdominal pain.  Neurological:  Negative for headaches.  Psychiatric/Behavioral:  Positive for depression. Negative for hallucinations, substance abuse (uses CBD gummies) and suicidal ideas. The patient has insomnia (reports sleeping well in [place] (unusual).). The patient is not nervous/anxious.   Blood pressure (!) 199/98, pulse 84, temperature 97.7 F (36.5 C), temperature source Oral, resp. rate 14, height 5\' 5"  (1.651 m), weight (!) 168.7 kg, SpO2 96 %. Body mass index is 61.9 kg/m.   Demographic Factors:  Caucasian  Loss Factors: Decline in physical health  Historical Factors: NA  Risk Reduction Factors:   Positive social support and Positive therapeutic relationship  Continued Clinical Symptoms:  Previous Psychiatric Diagnoses and Treatments  Cognitive Features That Contribute To Risk:  None    Suicide Risk:  Mild:  Suicidal ideation of limited frequency, intensity, duration, and specificity.  There are no identifiable plans, no associated intent, mild  dysphoria and related symptoms, good self-control (both objective and subjective assessment), few other risk factors, and identifiable protective factors, including available and accessible social support.    Plan Of Care/Follow-up recommendations:  Other:  Safety planning reviewed in detail. This patient is psychiatrically cleared to get psychiatric treatment as outpatient with resources provided per behavior health specialist. Discussed partial [facility] program with patient, she does not want to pursue that at this time. Information will be provided in case she changes her mind.  Agrees with plan and wishes to discharge home for outpatient treatment. Recommend follow-up for uncontrolled blood pressure.   Disposition: Psychiatrically cleared for outpatient treatment.    Suicide prevention.  Call 911 or 988 Suicide crisis hotline: # [phone] Call [facility]: [phone] Return to nearest emergency department. Facility-based crisis unit in [place], [place].     [place], NP 09/23/2021, 1:47 PM

## 2021-12-02 ENCOUNTER — Ambulatory Visit (HOSPITAL_COMMUNITY)
Admission: RE | Admit: 2021-12-02 | Discharge: 2021-12-02 | Disposition: A | Discharge status: Home / Self Care | Payer: Medicaid Other | Attending: Psychiatry | Admitting: Psychiatry

## 2021-12-02 NOTE — H&P (Signed)
Behavioral Health Medical Screening Exam  Mary Mack is a 36 y.o. female with a past psychiatric history of borderline personality disorder, anxiety, and depression (possible schizoaffective disorder per patient report) who presents to [facility] as a walk-in due to worsening depression. Patient endorses depressive symptoms, including low mood, sleep alteration (sleeps less than 5 hours), loss of interest in pleasurable activities, feelings of guilt/worthlessness/hopelessness, problems with energy, problems with concentration, appetite disturbance (decreased appetite, but eating unhealthy foods).  Patient reports passive suicidal ideations. She denies any intent or plan to harm herself. She denies active suicidal ideations. Patient reports that she feels safe returning home. She states that she is open to inpatient treatment but really only wants some resources for therapy as she has an upcoming appointment with [facility].   Patient reports that she has not taken her psychotropic medications for 3-4 weeks. She states that she received a letter from [facility] discharging her from he practice. Patient does not recall the names of all psychotropics. She states that she was prescribed clonazepam 0.5 mg daily prn for anxiety. Per PDMP a prescription for clonazepam 0.5 mg #30 was filled on 11/18/2021.   On evaluation patient is alert and oriented x 4, pleasant, and cooperative. Speech is clear and coherent. Mood is depressed and affect is congruent with mood. Tearful at times. Thought process is coherent and thought content is logical. Denies auditory and visual hallucinations. No indication that patient is responding to internal stimuli. No evidence of delusional thought content. Denies suicidal ideations. Denies homicidal ideations. Denies substance abuse.    Total Time spent with patient: 30 minutes  Psychiatric Specialty Exam:  Presentation  General Appearance: Casual  Eye  Contact:Fair  Speech:Clear and Coherent; Normal Rate  Speech Volume:Normal  Handedness:No data recorded  Mood and Affect  Mood:Anxious; Dysphoric  Affect:Congruent; Tearful   Thought Process  Thought Processes:Coherent; Goal Directed; Linear  Descriptions of Associations:Intact  Orientation:Full (Time, Place and Person)  Thought Content:Logical  History of Schizophrenia/Schizoaffective disorder:No  Duration of Psychotic Symptoms:No data recorded Hallucinations:Hallucinations: None  Ideas of Reference:None  Suicidal Thoughts:Suicidal Thoughts: Yes, Passive SI Passive Intent and/or Plan: Without Intent; Without Plan  Homicidal Thoughts:Homicidal Thoughts: No   Sensorium  Memory:Immediate Good; Recent Fair  Judgment:Fair  Insight:Fair   Executive Functions  Concentration:Good  Attention Span:Good  Recall:Fair  Fund of Knowledge:Fair  Language:Good   Psychomotor Activity  Psychomotor Activity:Psychomotor Activity: Normal   Assets  Assets:Communication Skills; Desire for Improvement; Financial Resources/Insurance; Housing   Sleep  Sleep:Sleep: Fair    Physical Exam: Physical Exam Vitals reviewed.  Constitutional:      General: She is not in acute distress.    Appearance: She is not ill-appearing, toxic-appearing or diaphoretic.  Eyes:     General:        Right eye: No discharge.        Left eye: No discharge.     Pupils: Pupils are equal, round, and reactive to light.  Cardiovascular:     Rate and Rhythm: Normal rate.  Pulmonary:     Effort: Pulmonary effort is normal. No respiratory distress.  Musculoskeletal:        General: Normal range of motion.  Neurological:     General: No focal deficit present.     Mental Status: She is alert and oriented to person, place, and time.  Psychiatric:        Mood and Affect: Mood is anxious and depressed.        Behavior: Behavior is  cooperative.        Thought Content: Thought content is not  paranoid or delusional. Thought content does not include homicidal ideation.   Review of Systems  Constitutional:  Negative for chills, diaphoresis, fever, malaise/fatigue and weight loss.  HENT:  Negative for congestion.   Respiratory:  Negative for cough and shortness of breath.   Cardiovascular:  Negative for chest pain and palpitations.  Gastrointestinal:  Negative for diarrhea, nausea and vomiting.  Neurological:  Negative for dizziness and seizures.  Psychiatric/Behavioral:  Positive for depression and suicidal ideas. Negative for hallucinations, memory loss and substance abuse. The patient is nervous/anxious and has insomnia.   All other systems reviewed and are negative. Blood pressure (!) 167/93, pulse 96, temperature 97.8 F (36.6 C), temperature source Oral, SpO2 99 %. There is no height or weight on file to calculate BMI.  Musculoskeletal: Strength & Muscle Tone: within normal limits Gait & Station: normal Patient leans: N/A   Recommendations:  Based on my evaluation the patient does not appear to have an emergency medical condition.  Disposition: No evidence of imminent risk to self or others at present.   Patient does not meet criteria for psychiatric inpatient admission. Supportive therapy provided about ongoing stressors. Discussed crisis plan, support from social network, calling 911, coming to the Emergency Department, and calling Suicide Hotline.  Patient encouraged to keep reported appointment with [facility] for 12/19/2020. Patient provided with additional outpatient resources.  Jackelyn Poling, NP 12/03/2021, 12:06 AM
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
## 2022-03-05 ENCOUNTER — Telehealth: Payer: Self-pay

## 2022-03-05 NOTE — Telephone Encounter (Signed)
NOTES SCANNED TO REFERRAL 

## 2022-03-06 ENCOUNTER — Other Ambulatory Visit: Payer: Self-pay

## 2022-03-06 DIAGNOSIS — E1165 Type 2 diabetes mellitus with hyperglycemia: Secondary | ICD-10-CM | POA: Insufficient documentation

## 2022-03-06 DIAGNOSIS — F4310 Post-traumatic stress disorder, unspecified: Secondary | ICD-10-CM | POA: Insufficient documentation

## 2022-03-06 DIAGNOSIS — N939 Abnormal uterine and vaginal bleeding, unspecified: Secondary | ICD-10-CM | POA: Insufficient documentation

## 2022-03-06 DIAGNOSIS — R112 Nausea with vomiting, unspecified: Secondary | ICD-10-CM | POA: Insufficient documentation

## 2022-03-06 DIAGNOSIS — F489 Nonpsychotic mental disorder, unspecified: Secondary | ICD-10-CM | POA: Insufficient documentation

## 2022-03-06 DIAGNOSIS — F17210 Nicotine dependence, cigarettes, uncomplicated: Secondary | ICD-10-CM | POA: Insufficient documentation

## 2022-03-06 DIAGNOSIS — J9601 Acute respiratory failure with hypoxia: Secondary | ICD-10-CM | POA: Insufficient documentation

## 2022-03-06 DIAGNOSIS — E119 Type 2 diabetes mellitus without complications: Secondary | ICD-10-CM | POA: Insufficient documentation

## 2022-03-06 DIAGNOSIS — E1142 Type 2 diabetes mellitus with diabetic polyneuropathy: Secondary | ICD-10-CM | POA: Insufficient documentation

## 2022-03-06 DIAGNOSIS — E559 Vitamin D deficiency, unspecified: Secondary | ICD-10-CM | POA: Insufficient documentation

## 2022-03-06 DIAGNOSIS — H538 Other visual disturbances: Secondary | ICD-10-CM | POA: Insufficient documentation

## 2022-03-06 DIAGNOSIS — F419 Anxiety disorder, unspecified: Secondary | ICD-10-CM | POA: Insufficient documentation

## 2022-03-06 DIAGNOSIS — L259 Unspecified contact dermatitis, unspecified cause: Secondary | ICD-10-CM | POA: Insufficient documentation

## 2022-03-06 DIAGNOSIS — G8929 Other chronic pain: Secondary | ICD-10-CM | POA: Insufficient documentation

## 2022-03-06 DIAGNOSIS — E441 Mild protein-calorie malnutrition: Secondary | ICD-10-CM | POA: Insufficient documentation

## 2022-03-06 DIAGNOSIS — R0789 Other chest pain: Secondary | ICD-10-CM | POA: Insufficient documentation

## 2022-03-06 DIAGNOSIS — I10 Essential (primary) hypertension: Secondary | ICD-10-CM | POA: Insufficient documentation

## 2022-03-06 DIAGNOSIS — L988 Other specified disorders of the skin and subcutaneous tissue: Secondary | ICD-10-CM | POA: Insufficient documentation

## 2022-03-06 DIAGNOSIS — F259 Schizoaffective disorder, unspecified: Secondary | ICD-10-CM | POA: Insufficient documentation

## 2022-03-06 DIAGNOSIS — D509 Iron deficiency anemia, unspecified: Secondary | ICD-10-CM | POA: Insufficient documentation

## 2022-03-06 DIAGNOSIS — K219 Gastro-esophageal reflux disease without esophagitis: Secondary | ICD-10-CM | POA: Insufficient documentation

## 2022-03-06 DIAGNOSIS — M549 Dorsalgia, unspecified: Secondary | ICD-10-CM | POA: Insufficient documentation

## 2022-03-06 DIAGNOSIS — R6889 Other general symptoms and signs: Secondary | ICD-10-CM | POA: Insufficient documentation

## 2022-03-06 DIAGNOSIS — D72829 Elevated white blood cell count, unspecified: Secondary | ICD-10-CM

## 2022-03-06 DIAGNOSIS — H579 Unspecified disorder of eye and adnexa: Secondary | ICD-10-CM | POA: Insufficient documentation

## 2022-03-06 DIAGNOSIS — T300 Burn of unspecified body region, unspecified degree: Secondary | ICD-10-CM | POA: Insufficient documentation

## 2022-03-06 DIAGNOSIS — G40A09 Absence epileptic syndrome, not intractable, without status epilepticus: Secondary | ICD-10-CM | POA: Insufficient documentation

## 2022-03-06 DIAGNOSIS — U071 COVID-19: Secondary | ICD-10-CM | POA: Insufficient documentation

## 2022-03-06 DIAGNOSIS — R609 Edema, unspecified: Secondary | ICD-10-CM | POA: Insufficient documentation

## 2022-03-06 DIAGNOSIS — I951 Orthostatic hypotension: Secondary | ICD-10-CM | POA: Insufficient documentation

## 2022-03-06 DIAGNOSIS — D803 Selective deficiency of immunoglobulin G [IgG] subclasses: Secondary | ICD-10-CM | POA: Insufficient documentation

## 2022-03-06 DIAGNOSIS — J449 Chronic obstructive pulmonary disease, unspecified: Secondary | ICD-10-CM | POA: Insufficient documentation

## 2022-03-06 DIAGNOSIS — I5032 Chronic diastolic (congestive) heart failure: Secondary | ICD-10-CM | POA: Insufficient documentation

## 2022-03-06 DIAGNOSIS — S2243XG Multiple fractures of ribs, bilateral, subsequent encounter for fracture with delayed healing: Secondary | ICD-10-CM | POA: Insufficient documentation

## 2022-03-06 DIAGNOSIS — E11319 Type 2 diabetes mellitus with unspecified diabetic retinopathy without macular edema: Secondary | ICD-10-CM | POA: Insufficient documentation

## 2022-03-06 DIAGNOSIS — J479 Bronchiectasis, uncomplicated: Secondary | ICD-10-CM | POA: Insufficient documentation

## 2022-03-06 DIAGNOSIS — J309 Allergic rhinitis, unspecified: Secondary | ICD-10-CM | POA: Insufficient documentation

## 2022-03-06 DIAGNOSIS — J42 Unspecified chronic bronchitis: Secondary | ICD-10-CM | POA: Insufficient documentation

## 2022-03-06 DIAGNOSIS — M542 Cervicalgia: Secondary | ICD-10-CM | POA: Insufficient documentation

## 2022-03-06 DIAGNOSIS — J45909 Unspecified asthma, uncomplicated: Secondary | ICD-10-CM | POA: Insufficient documentation

## 2022-03-06 DIAGNOSIS — G4733 Obstructive sleep apnea (adult) (pediatric): Secondary | ICD-10-CM | POA: Insufficient documentation

## 2022-03-06 DIAGNOSIS — B372 Candidiasis of skin and nail: Secondary | ICD-10-CM | POA: Insufficient documentation

## 2022-03-06 DIAGNOSIS — F3132 Bipolar disorder, current episode depressed, moderate: Secondary | ICD-10-CM | POA: Insufficient documentation

## 2022-03-06 HISTORY — DX: Elevated white blood cell count, unspecified: D72.829

## 2022-03-07 ENCOUNTER — Encounter: Payer: Self-pay | Admitting: Cardiology

## 2022-03-07 ENCOUNTER — Ambulatory Visit (INDEPENDENT_AMBULATORY_CARE_PROVIDER_SITE_OTHER): Payer: Medicaid Other | Admitting: Cardiology

## 2022-03-07 VITALS — BP 164/88 | HR 91 | Ht 65.0 in | Wt 337.0 lb

## 2022-03-07 DIAGNOSIS — R011 Cardiac murmur, unspecified: Secondary | ICD-10-CM

## 2022-03-07 DIAGNOSIS — F17210 Nicotine dependence, cigarettes, uncomplicated: Secondary | ICD-10-CM

## 2022-03-07 NOTE — Patient Instructions (Signed)
Please keep a BP log for 2 weeks and send by MyChart or mail. ? ?Blood Pressure Record Sheet ?To take your blood pressure, you will need a blood pressure machine. You can buy a blood pressure machine (blood pressure monitor) at your clinic, drug store, or online. When choosing one, consider: ?An automatic monitor that has an arm cuff. ?A cuff that wraps snugly around your upper arm. You should be able to fit only one finger between your arm and the cuff. ?A device that stores blood pressure reading results. ?Do not choose a monitor that measures your blood pressure from your wrist or finger. ?Follow your health care provider's instructions for how to take your blood pressure. To use this form: ?Get one reading in the morning (a.m.) 1-2 hours after you take any medicines. ?Get one reading in the evening (p.m.) before supper. ?Write down the results in the spaces on this form. ?Repeat this once a week, or as told by your health care provider. ? ?Make a follow-up appointment with your health care provider to discuss the results. ?Blood pressure log ?Date: _______________________ ?a.m. _____________________(1st reading) HR___________ ? ?          p.m. _____________________(2nd reading) HR__________ ? ?Date: _______________________ ?a.m. _____________________(1st reading) HR___________ ? ?          p.m. _____________________(2nd reading) HR__________ ?Date: _______________________ ?a.m. _____________________(1st reading) HR___________ ? ?          p.m. _____________________(2nd reading) HR__________ ?Date: _______________________ ?a.m. _____________________(1st reading) HR___________ ? ?          p.m. _____________________(2nd reading) HR__________ ? ?Date: _______________________ ?a.m. _____________________(1st reading) HR___________ ? ?          p.m. _____________________(2nd reading) HR__________ ? ?Date: _______________________ ?a.m. _____________________(1st reading) HR___________ ? ?          p.m.  _____________________(2nd reading) HR__________ ? ?Date: _______________________ ?a.m. _____________________(1st reading) HR___________ ? ?          p.m. _____________________(2nd reading) HR__________ ? ? ?This information is not intended to replace advice given to you by your health care provider. Make sure you discuss any questions you have with your health care provider. ?Document Revised: 02/29/2020 Document Reviewed: 02/29/2020 ?Elsevier Patient Education ? [place].  ? ? ?Medication Instructions:  ?Your physician recommends that you continue on your current medications as directed. Please refer to the Current Medication list given to you today. ? ?*If you need a refill on your cardiac medications before your next appointment, please call your pharmacy* ? ? ?Lab Work: ?None ordered ?If you have labs (blood work) drawn today and your tests are completely normal, you will receive your results only by: ?MyChart Message (if you have MyChart) OR ?A paper copy in the mail ?If you have any lab test that is abnormal or we need to change your treatment, we will call you to review the results. ? ? ?Testing/Procedures: ?Your physician has requested that you have an echocardiogram. Echocardiography is a painless test that uses sound waves to create images of your heart. It provides your doctor with information about the size and shape of your heart and how well your heart?s chambers and valves are working. This procedure takes approximately one hour. There are no restrictions for this procedure. ? ? ? ?Follow-Up: ?At [facility], you and your health needs are our priority.  As part of our continuing mission to provide you with exceptional heart care, we have created designated Provider Care Teams.  These Care Teams include your  primary Cardiologist (physician) and Advanced Practice Providers (APPs -  Physician Assistants and Nurse Practitioners) who all work together to provide you with the care you need, when  you need it. ? ?We recommend signing up for the patient portal called "MyChart".  Sign up information is provided on this After Visit Summary.  MyChart is used to connect with patients for Virtual Visits (Telemedicine).  Patients are able to view lab/test results, encounter notes, upcoming appointments, etc.  Non-urgent messages can be sent to your provider as well.   ?To learn more about what you can do with MyChart, go to [email or web address].   ? ?Your next appointment:   ?9 month(s) ? ?The format for your next appointment:   ?In Person ? ?Provider:   ?Jyl Heinz, MD ? ? ?Other Instructions ?Echocardiogram ?An echocardiogram is a test that uses sound waves (ultrasound) to produce images of the heart. ?Images from an echocardiogram can provide important information about: ?Heart size and shape. ?The size and thickness and movement of your heart's walls. ?Heart muscle function and strength. ?Heart valve function or if you have stenosis. Stenosis is when the heart valves are too narrow. ?If blood is flowing backward through the heart valves (regurgitation). ?A tumor or infectious growth around the heart valves. ?Areas of heart muscle that are not working well because of poor blood flow or injury from a heart attack. ?Aneurysm detection. An aneurysm is a weak or damaged part of an artery wall. The wall bulges out from the normal force of blood pumping through the body. ?Tell a health care provider about: ?Any allergies you have. ?All medicines you are taking, including vitamins, herbs, eye drops, creams, and over-the-counter medicines. ?Any blood disorders you have. ?Any surgeries you have had. ?Any medical conditions you have. ?Whether you are pregnant or may be pregnant. ?What are the risks? ?Generally, this is a safe test. However, problems may occur, including an allergic reaction to dye (contrast) that may be used during the test. ?What happens before the test? ?No specific preparation is needed. You may  eat and drink normally. ?What happens during the test? ?You will take off your clothes from the waist up and put on a hospital gown. ?Electrodes or electrocardiogram (ECG)patches may be placed on your chest. The electrodes or patches are then connected to a device that monitors your heart rate and rhythm. ?You will lie down on a table for an ultrasound exam. A gel will be applied to your chest to help sound waves pass through your skin. ?A handheld device, called a transducer, will be pressed against your chest and moved over your heart. The transducer produces sound waves that travel to your heart and bounce back (or "echo" back) to the transducer. These sound waves will be captured in real-time and changed into images of your heart that can be viewed on a video monitor. The images will be recorded on a computer and reviewed by your health care provider. ?You may be asked to change positions or hold your breath for a short time. This makes it easier to get different views or better views of your heart. ?In some cases, you may receive contrast through an IV in one of your veins. This can improve the quality of the pictures from your heart. ?The procedure may vary among health care providers and hospitals.   ?What can I expect after the test? ?You may return to your normal, everyday life, including diet, activities, and medicines, unless your health care provider tells  you not to do that. ?Follow these instructions at home: ?It is up to you to get the results of your test. Ask your health care provider, or the department that is doing the test, when your results will be ready. ?Keep all follow-up visits. This is important. ?Summary ?An echocardiogram is a test that uses sound waves (ultrasound) to produce images of the heart. ?Images from an echocardiogram can provide important information about the size and shape of your heart, heart muscle function, heart valve function, and other possible heart problems. ?You do  not need to do anything to prepare before this test. You may eat and drink normally. ?After the echocardiogram is completed, you may return to your normal, everyday life, unless your health care provider tells you not

## 2022-03-07 NOTE — Progress Notes (Signed)
?Cardiology Office Note:   ? ?Date:  03/07/2022  ? ?ID:  Mary Mack, DOB [birth year], MRN [id number] ? ?PCP:  Wynelle Fanny, DO  ?Cardiologist:  Jenean Lindau, MD  ? ?Referring MD: Wynelle Fanny, DO  ? ? ?ASSESSMENT:   ? ?No diagnosis found. ?PLAN:   ? ?In order of problems listed above: ? ?Primary prevention stressed with the patient.  Importance of compliance with diet medication stressed and she vocalized understanding. ?Cardiomyopathy: She is on guideline directed medical therapy.  Diet was emphasized.  Congestive heart failure education was given.  She will have an echocardiogram to follow-up on these findings.  Patient is on multiple medications and will have a Chem-7 today. ?Essential hypertension: Blood pressure stable and diet was emphasized.  Lifestyle modification urged.  She will have blood work today.  Also blood pressure readings from home will be obtained primary in the next few days to assess optimization of blood pressure control.  Diet including salt intake issues were discussed. ?Mixed dyslipidemia, diabetes mellitus and morbid obesity: Risks explained risks of obesity emphasized to her at extensive length and she promises to do better.  Lifestyle modification urged. ?Cigarette smoker: I spent 5 minutes with the patient discussing solely about smoking. Smoking cessation was counseled. I suggested to the patient also different medications and pharmacological interventions. Patient is keen to try stopping on its own at this time. He will get back to me if he needs any further assistance in this matter. ?Patient will be seen in follow-up appointment in 6 months or earlier if the patient has any concerns ? ? ? ?Medication Adjustments/Labs and Tests Ordered: ?Current medicines are reviewed at length with the patient today.  Concerns regarding medicines are outlined above.  ?No orders of the defined types were placed in this encounter. ? ?No orders of the defined types were placed in this  encounter. ? ? ? ?History of Present Illness:   ? ?Mary Mack is a 36 y.o. female who is being seen today for the evaluation of essential hypertension and cardiomyopathy at the request of Wynelle Fanny, DO.  Patient is a 36 year old female she has past medical history of essential hypertension, dyslipidemia, diabetes mellitus, active cigarette smoking and morbid obesity.  She has history of bipolar disorder.  She is on disability for these reasons.  She is here to get established as her cardiologist has moved to another down.  She denies any chest pain orthopnea or PND.  At the time of my evaluation, the patient is alert awake oriented and in no distress. ? ?Past Medical History:  ?Diagnosis Date  ? Abnormal ECG 12/21/2020  ? Abnormal uterine bleeding   ? Absence seizure ([place])   ? Acute respiratory failure due to COVID-19 [facility]) 12/05/2020  ? Acute respiratory failure with hypoxia ([place])   ? AKI (acute kidney injury) ([place]) 12/07/2020  ? Allergic rhinitis   ? Anxiety   ? Asthma   ? Back pain   ? Bipolar 1 disorder, depressed, moderate ([place])   ? Bipolar affective disorder, currently depressed, moderate ([place]) 03/25/2020  ? Bipolar disorder ([place]) 05/31/2018  ? Blurry vision   ? Bone disease, metabolic [id number]  ? Borderline personality disorder in adult [facility]) 09/22/2021  ? Bronchiectasis ([place])   ? Burn of skin   ? Candidal dermatitis   ? Cardiomyopathy ([place]) 12/20/2020  ? Chronic bronchitis ([place])   ? Chronic diastolic (congestive) heart failure (HCC)   ? Chronic pain   ? Cigarette nicotine dependence   ?  Class 3 severe obesity in adult [facility]) 10/11/2020  ? Contact dermatitis   ? COPD (chronic obstructive pulmonary disease) ([place])   ? Costochondral chest pain   ? COVID-19   ? Diabetes mellitus without complication ([place])   ? Diabetic retinopathy ([place])   ? Edema   ? Elevated blood-pressure reading without diagnosis of hypertension 06/07/2018  ? GERD (gastroesophageal reflux disease)   ? High blood pressure   ? Hyperglycemia without  ketosis 05/26/2021  ? Hyperkalemia 05/26/2021  ? Hypertension   ? Hypertensive heart and kidney disease with chronic diastolic congestive heart failure and stage 3a chronic kidney disease ([place])   ? Hypocalcemia 05/26/2021  ? Hyponatremia 05/26/2021  ? IgG deficiency ([place])   ? Iron deficiency anemia due to chronic blood loss 04/12/2020  ? Itchy eyes   ? Lesion of skin of breast   ? Leukocytosis 03/06/2022  ? Mental health problem   ? Microcytic anemia   ? Mild protein-calorie malnutrition ([place])   ? Morbid (severe) obesity due to excess calories ([place]) 06/07/2018  ? Morbid obesity ([place])   ? Multiple closed fractures of ribs of both sides with delayed healing, subsequent encounter   ? Multiple closed fractures of ribs of both sides with routine healing 11/13/2020  ? Nausea and vomiting   ? Neck pain   ? Obstructive sleep apnea syndrome   ? Orthostatic hypotension   ? Pathologic rib fracture 09/23/2019  ? Persistent cough 12/21/2019  ? Personality disorder ([place]) 05/31/2018  ? Pneumonia due to COVID-19 virus 12/05/2020  ? Polysubstance abuse ([place]) 09/22/2021  ? Precordial pain 12/21/2020  ? Psychosis not due to substance or known physiological condition ([place]) 05/31/2018  ? PTSD (post-traumatic stress disorder)   ? Reaction to severe stress 05/31/2018  ? Schizoaffective disorder ([place])   ? Severe major depression ([employer]) 09/22/2021  ? Shortness of breath 11/26/2020  ? Suicidal ideations 09/22/2021  ? Type 2 diabetes mellitus with diabetic polyneuropathy, with long-term current use of insulin ([place])   ? Type 2 diabetes mellitus with hyperglycemia (HCC)   ? Uncontrolled type 2 diabetes mellitus with hypoglycemia ([place]) 08/16/2018  ? Vitamin D deficiency   ? ? ?Past Surgical History:  ?Procedure Laterality Date  ? TONSILLECTOMY    ? TUBAL LIGATION    ? ? ?Current Medications: ?No outpatient medications have been marked as taking for the 03/07/22 encounter (Appointment) with Tomeca Helm, Reita Cliche, MD.  ?  ? ?Allergies:   Doxycycline hyclate, Loracarbef,  Ciprofloxacin, Tape, and Vancomycin  ? ?Social History  ? ?Socioeconomic History  ? Marital status: Single  ?  Spouse name: Not on file  ? Number of children: Not on file  ? Years of education: Not on file  ? Highest education level: Not on file  ?Occupational History  ? Not on file  ?Tobacco Use  ? Smoking status: Every Day  ? Smokeless tobacco: Never  ?Substance and Sexual Activity  ? Alcohol use: Not Currently  ? Drug use: Never  ? Sexual activity: Not on file  ?Other Topics Concern  ? Not on file  ?Social History Narrative  ? Not on file  ? ?Social Determinants of Health  ? ?Financial Resource Strain: Not on file  ?Food Insecurity: Not on file  ?Transportation Needs: Not on file  ?Physical Activity: Not on file  ?Stress: Not on file  ?Social Connections: Not on file  ?  ? ?Family History: ?The patient's family history is not on file. ? ?ROS:   ?Please see  the history of present illness.    ?All other systems reviewed and are negative. ? ?EKGs/Labs/Other Studies Reviewed:   ? ?The following studies were reviewed today: ?Impression ? ?Left Ventricle: Left ventricle size is normal.  ?  Left Ventricle: Wall thickness is normal.  ?  Left Ventricle: EF: 45-50%.  ?  Left Ventricle: There is diffuse  hypokinesis of the left ventricle.  ?  Left Ventricle: Doppler parameters consistent with mild diastolic  ?dysfunction and low to normal LA pressure.  ?  Left Atrium: Left atrium is normal in size. Left atrium volume index is  ?normal (16-34 mL/m2).  ?  Aortic Valve: There is no regurgitation or stenosis.  ?  Mitral Valve: There is mild regurgitation with a centrally directed  ?jet.  ?  Tricuspid Valve: There is mild regurgitation.  ?  Tricuspid Valve: The right ventricular systolic pressure is [id number] -  ?normal (<36 mmHg). ? ? ?Recent Labs: ?09/21/2021: ALT 16; B Natriuretic Peptide 61.7; BUN 18; Creatinine, Ser 1.61; Hemoglobin 14.3; Platelets 334; Potassium 3.6; Sodium 134  ?Recent Lipid Panel ?No results found for:  CHOL, TRIG, HDL, CHOLHDL, VLDL, LDLCALC, LDLDIRECT ? ?Physical Exam:   ? ?VS:  There were no vitals taken for this visit.   ? ?Wt Readings from Last 3 Encounters:  ?09/21/21 (!) 372 lb (168.7 kg)  ?08/21/19 298 lb (135.2 kg

## 2022-03-18 ENCOUNTER — Other Ambulatory Visit: Payer: Medicaid Other

## 2022-03-31 ENCOUNTER — Other Ambulatory Visit: Payer: Medicaid Other

## 2022-04-10 ENCOUNTER — Other Ambulatory Visit: Payer: Medicaid Other

## 2022-04-24 DEATH — deceased
